# Patient Record
Sex: Female | Born: 1970 | Race: White | Hispanic: No | Marital: Single | Smoking: Never smoker
Health system: Southern US, Community
[De-identification: ages and names within clinical notes are randomized; demographics above are authoritative.]

## PROBLEM LIST (undated history)

## (undated) DIAGNOSIS — J302 Other seasonal allergic rhinitis: Secondary | ICD-10-CM

## (undated) DIAGNOSIS — E669 Obesity, unspecified: Secondary | ICD-10-CM

## (undated) DIAGNOSIS — R87619 Unspecified abnormal cytological findings in specimens from cervix uteri: Secondary | ICD-10-CM

## (undated) DIAGNOSIS — T7840XA Allergy, unspecified, initial encounter: Secondary | ICD-10-CM

## (undated) DIAGNOSIS — M719 Bursopathy, unspecified: Secondary | ICD-10-CM

## (undated) DIAGNOSIS — R87612 Low grade squamous intraepithelial lesion on cytologic smear of cervix (LGSIL): Secondary | ICD-10-CM

## (undated) DIAGNOSIS — B009 Herpesviral infection, unspecified: Secondary | ICD-10-CM

## (undated) HISTORY — DX: Unspecified abnormal cytological findings in specimens from cervix uteri: R87.619

## (undated) HISTORY — DX: Other seasonal allergic rhinitis: J30.2

## (undated) HISTORY — DX: Obesity, unspecified: E66.9

## (undated) HISTORY — PX: TUBAL LIGATION: SHX77

## (undated) HISTORY — DX: Bursopathy, unspecified: M71.9

## (undated) HISTORY — DX: Herpesviral infection, unspecified: B00.9

## (undated) HISTORY — DX: Allergy, unspecified, initial encounter: T78.40XA

---
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
## 2001-01-01 ENCOUNTER — Other Ambulatory Visit: Admission: RE | Admit: 2001-01-01 | Discharge: 2001-01-01 | Payer: Self-pay | Admitting: *Deleted

## 2001-10-23 ENCOUNTER — Other Ambulatory Visit: Admission: RE | Admit: 2001-10-23 | Discharge: 2001-10-23 | Payer: Self-pay | Admitting: *Deleted

## 2002-05-21 ENCOUNTER — Encounter (INDEPENDENT_AMBULATORY_CARE_PROVIDER_SITE_OTHER): Payer: Self-pay

## 2002-05-21 ENCOUNTER — Inpatient Hospital Stay (HOSPITAL_COMMUNITY): Admission: AD | Admit: 2002-05-21 | Discharge: 2002-05-24 | Payer: Self-pay | Admitting: Gynecology

## 2002-07-02 ENCOUNTER — Other Ambulatory Visit: Admission: RE | Admit: 2002-07-02 | Discharge: 2002-07-02 | Payer: Self-pay | Admitting: Gynecology

## 2003-06-11 HISTORY — PX: GASTRIC BYPASS: SHX52

## 2003-10-04 ENCOUNTER — Other Ambulatory Visit: Admission: RE | Admit: 2003-10-04 | Discharge: 2003-10-04 | Payer: Self-pay | Admitting: Gynecology

## 2003-11-02 ENCOUNTER — Encounter: Admission: RE | Admit: 2003-11-02 | Discharge: 2003-11-02 | Payer: Self-pay | Admitting: Surgery

## 2003-11-02 ENCOUNTER — Ambulatory Visit (HOSPITAL_COMMUNITY): Admission: RE | Admit: 2003-11-02 | Discharge: 2003-11-02 | Payer: Self-pay | Admitting: Surgery

## 2003-11-03 ENCOUNTER — Encounter: Admission: RE | Admit: 2003-11-03 | Discharge: 2004-02-01 | Payer: Self-pay | Admitting: Surgery

## 2004-01-10 ENCOUNTER — Inpatient Hospital Stay (HOSPITAL_COMMUNITY): Admission: RE | Admit: 2004-01-10 | Discharge: 2004-01-13 | Payer: Self-pay | Admitting: Surgery

## 2004-02-20 ENCOUNTER — Encounter: Admission: RE | Admit: 2004-02-20 | Discharge: 2004-05-20 | Payer: Self-pay | Admitting: Surgery

## 2004-06-26 ENCOUNTER — Encounter: Admission: RE | Admit: 2004-06-26 | Discharge: 2004-09-24 | Payer: Self-pay | Admitting: Surgery

## 2004-09-26 ENCOUNTER — Encounter: Admission: RE | Admit: 2004-09-26 | Discharge: 2004-12-25 | Payer: Self-pay | Admitting: Surgery

## 2004-10-12 ENCOUNTER — Other Ambulatory Visit: Admission: RE | Admit: 2004-10-12 | Discharge: 2004-10-12 | Payer: Self-pay | Admitting: Gynecology

## 2005-10-21 ENCOUNTER — Other Ambulatory Visit: Admission: RE | Admit: 2005-10-21 | Discharge: 2005-10-21 | Payer: Self-pay | Admitting: Gynecology

## 2006-01-20 ENCOUNTER — Encounter: Admission: RE | Admit: 2006-01-20 | Discharge: 2006-01-20 | Payer: Self-pay | Admitting: Gynecology

## 2006-01-20 IMAGING — MG MM SCREEN MAMMOGRAM BILATERAL
4 series · 4 of 4 positions shown · non-contrast
Comparison: none

DG SCREEN MAMMOGRAM BILATERAL
Bilateral CC and MLO view(s) were taken.

SCREENING MAMMOGRAM:
The breast tissue is almost entirely fatty.  There are calcifications in the right breast.  
Characterization with magnification views is recommended.  The left breast is unremarkable.

[R CC]
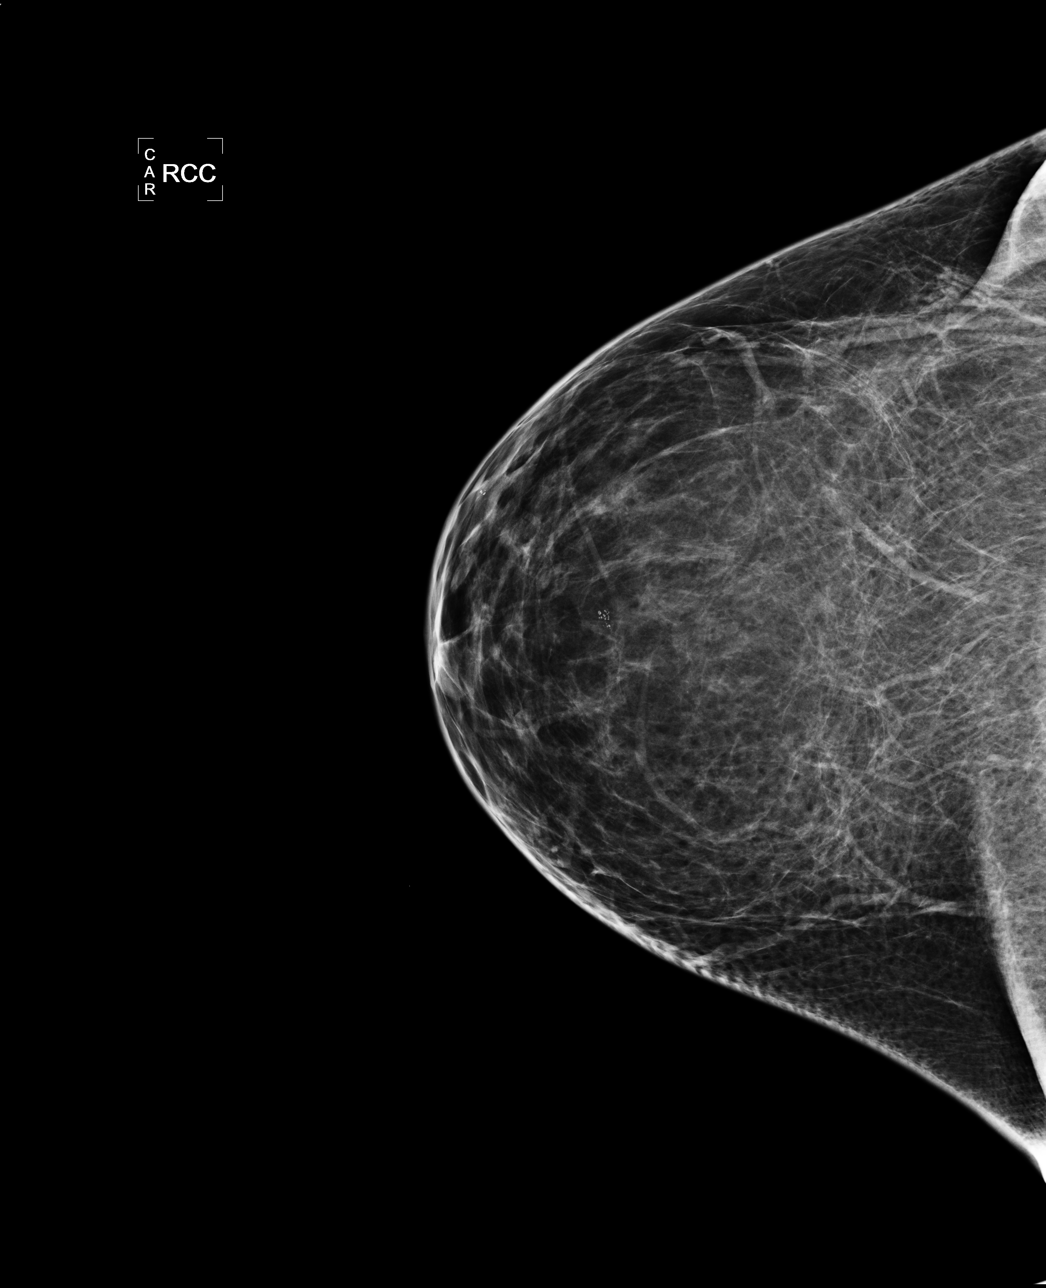

[L CC]
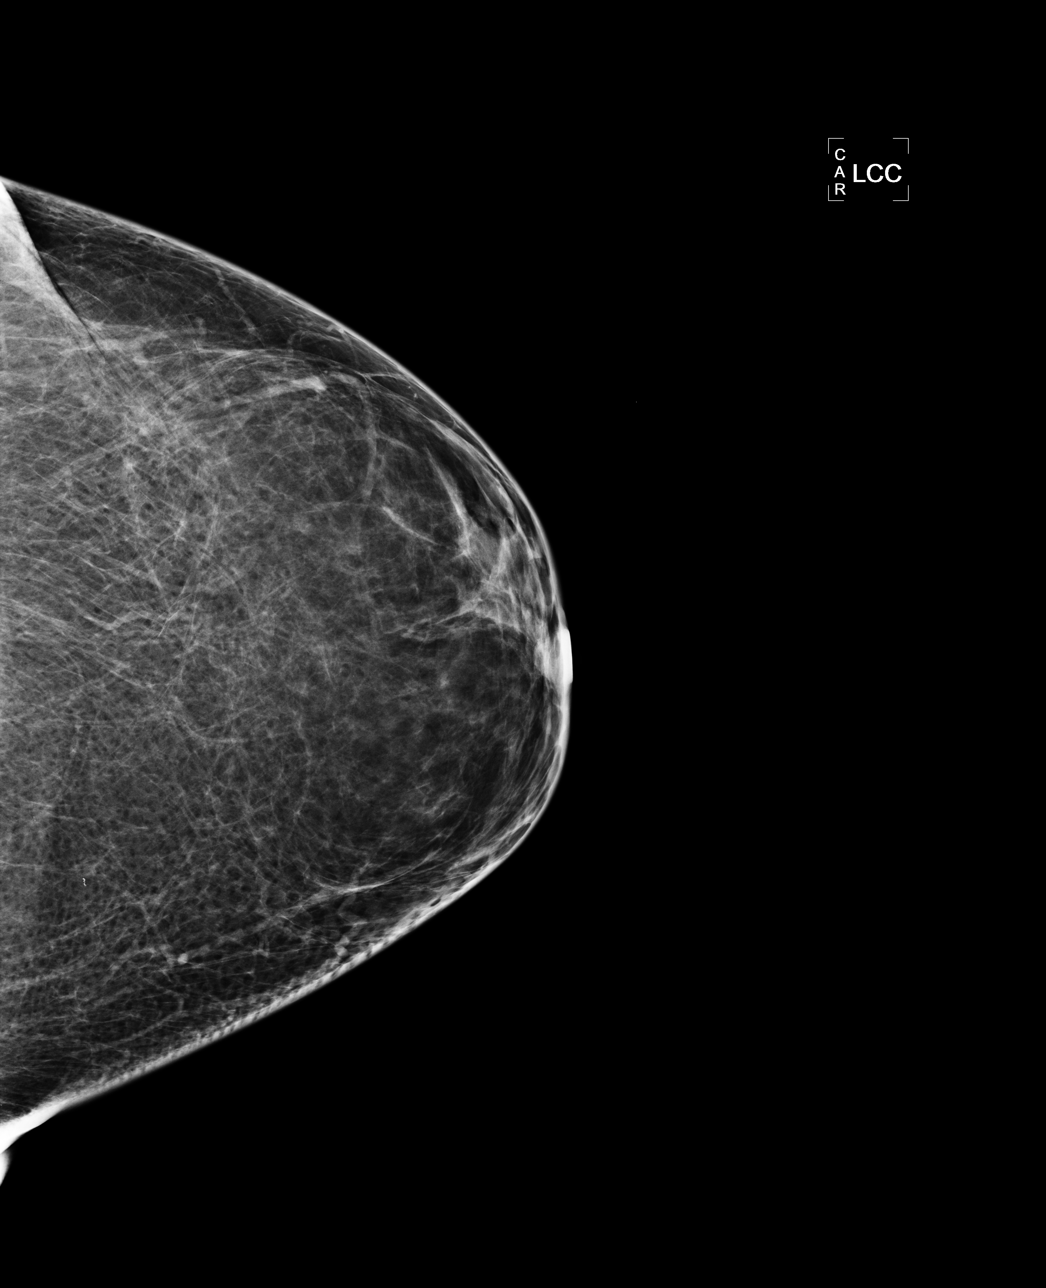

[L MLO]
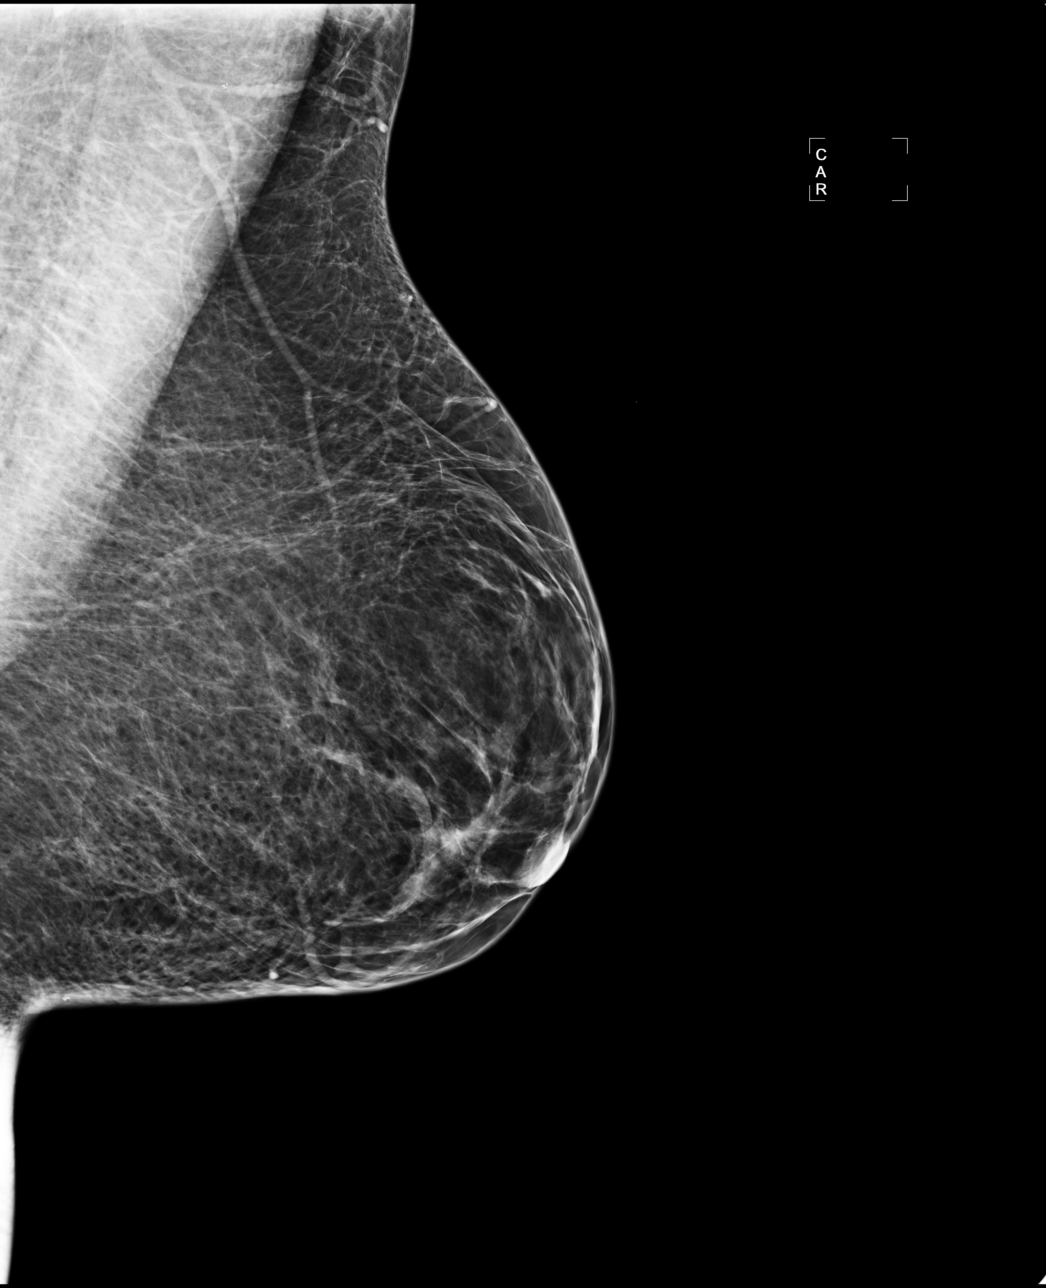

[R MLO]
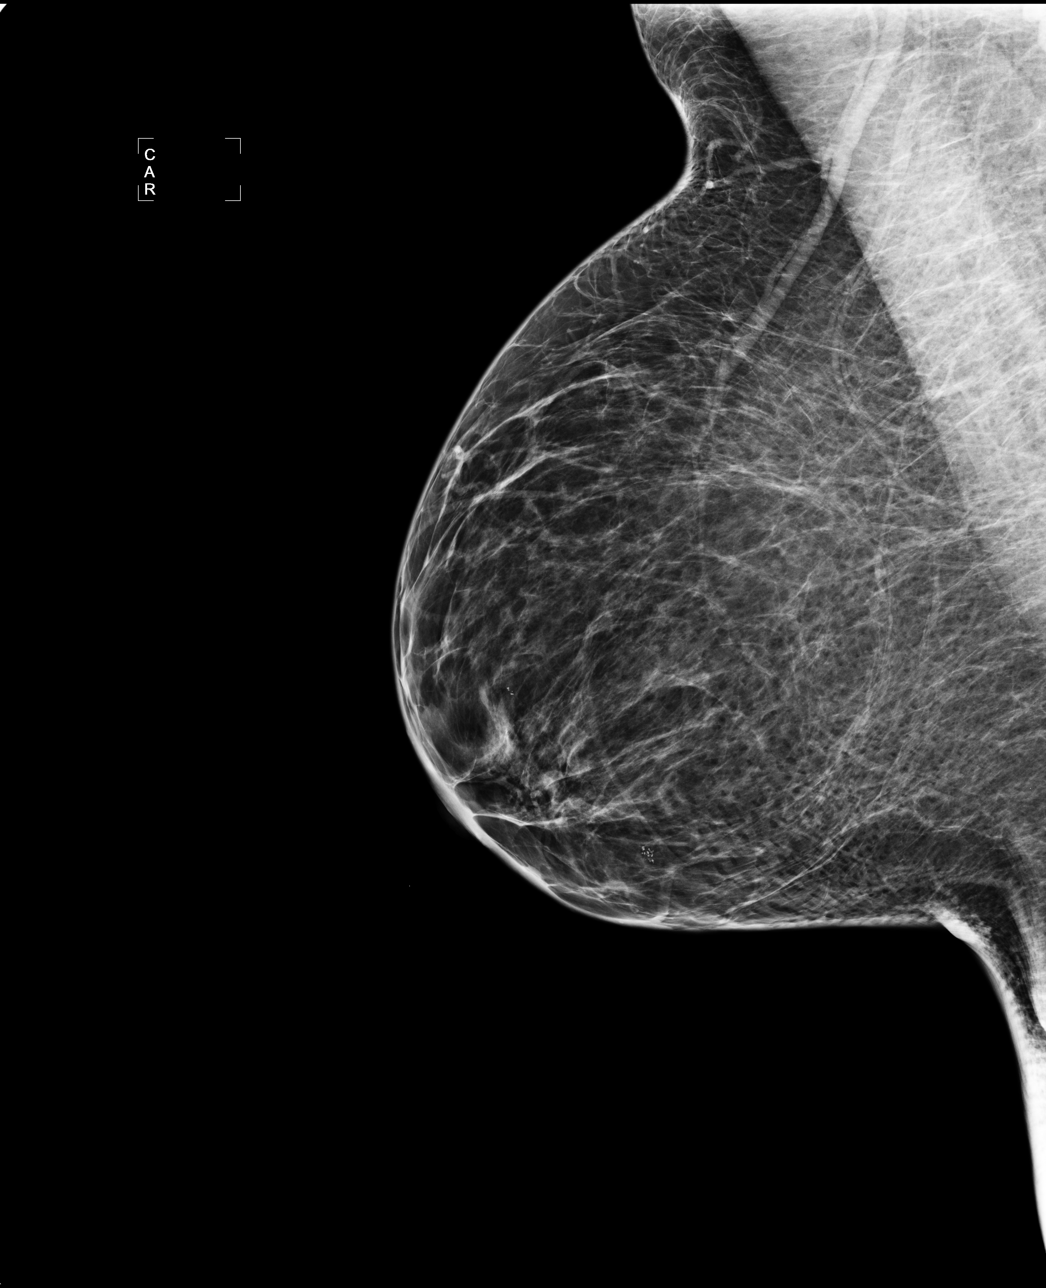

[4 of 4 positions shown; findings below may reference images not displayed]

IMPRESSION: Calcifications, right breast.  Additional evaluation is indicated.  The patient will be contacted 
for additional studies and a supplemental report will follow.

ASSESSMENT: Need additional imaging evaluation and/or prior mammograms for comparison - BI-RADS 0 -
Right

Further imaging of the right breast.
ANALYZED BY COMPUTER AIDED DETECTION. , THIS PROCEDURE WAS A DIGITAL MAMMOGRAM.

## 2006-01-24 ENCOUNTER — Encounter: Admission: RE | Admit: 2006-01-24 | Discharge: 2006-01-24 | Payer: Self-pay | Admitting: Gynecology

## 2006-10-31 ENCOUNTER — Other Ambulatory Visit: Admission: RE | Admit: 2006-10-31 | Discharge: 2006-10-31 | Payer: Self-pay | Admitting: Gynecology

## 2007-11-19 ENCOUNTER — Other Ambulatory Visit: Admission: RE | Admit: 2007-11-19 | Discharge: 2007-11-19 | Payer: Self-pay | Admitting: Gynecology

## 2008-04-07 ENCOUNTER — Ambulatory Visit: Payer: Self-pay | Admitting: Gynecology

## 2008-11-23 ENCOUNTER — Encounter: Admission: RE | Admit: 2008-11-23 | Discharge: 2008-11-23 | Payer: Self-pay | Admitting: Family Medicine

## 2008-12-20 ENCOUNTER — Other Ambulatory Visit: Admission: RE | Admit: 2008-12-20 | Discharge: 2008-12-20 | Payer: Self-pay | Admitting: Gynecology

## 2008-12-20 ENCOUNTER — Ambulatory Visit: Payer: Self-pay | Admitting: Gynecology

## 2008-12-20 ENCOUNTER — Encounter: Payer: Self-pay | Admitting: Gynecology

## 2009-06-08 ENCOUNTER — Ambulatory Visit: Payer: Self-pay | Admitting: Gynecology

## 2009-06-13 ENCOUNTER — Ambulatory Visit: Payer: Self-pay | Admitting: Gynecology

## 2009-09-14 ENCOUNTER — Ambulatory Visit: Payer: Self-pay | Admitting: Gynecology

## 2009-12-28 ENCOUNTER — Ambulatory Visit: Payer: Self-pay | Admitting: Gynecology

## 2009-12-28 ENCOUNTER — Other Ambulatory Visit: Admission: RE | Admit: 2009-12-28 | Discharge: 2009-12-28 | Payer: Self-pay | Admitting: Gynecology

## 2010-01-17 ENCOUNTER — Encounter: Admission: RE | Admit: 2010-01-17 | Discharge: 2010-01-17 | Payer: Self-pay | Admitting: Gynecology

## 2010-07-01 ENCOUNTER — Encounter: Payer: Self-pay | Admitting: Surgery

## 2010-10-26 NOTE — Op Note (Signed)
NAME:  Stacy Snyder, Stacy Snyder                           ACCOUNT NO.:  [id number]   MEDICAL RECORD NO.:  [id number]                   PATIENT TYPE:  INP   LOCATION:  0001                                 FACILITY:  [facility]   PHYSICIAN:  Thornton Park. Daphine Deutscher, M.D.             DATE OF BIRTH:  18-Oct-1970   DATE OF PROCEDURE:  01/10/2004  DATE OF DISCHARGE:                                 OPERATIVE REPORT   PREOPERATIVE DIAGNOSIS:  Morbid obesity with body mass index approximately  49.   POSTOPERATIVE DIAGNOSIS:  Morbid obesity with body mass index approximately  49.   PROCEDURE:  Laparoscopic Roux-en-Y gastric bypass.   SURGEON:  Luretha Murphy, M.D.   ASSISTANT:  Lorne Skeens. Hoxworth, M.D.   ANESTHESIA:  General endotracheal.   DESCRIPTION OF PROCEDURE:  [name]. 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  She was brought to OR #1, given general anesthesia.  The patient received preop Cefotetan and anticoagulant as well as PAS hose.  The abdomen was prepped widely with Betadine and draped sterilely.  Access  to the abdomen was gained with the OptiView technique in the left upper  quadrant using an Ethicon trocar.  The remaining portion of the case was  performed with the applied medical trocars.  A 5 mm was placed lateral to  the 12 in the left side, and then two 12s were placed on the right side and  a 10-11 down below the umbilicus.  Ultimately, a 5 mm was placed in the  upper midline for the [facility] retractor.  In the meantime, we elevated the  transverse colon and identified the ligament of Treitz.  I measured 40 cm  distally to the ligament of Treitz and divided the bowel at that point with  a single application of the GIA.  I then went down the mesentery a short  ways with the harmonic scalpel.  I put a Silastic tubing sewn to the distal  Roux limb.  I then counted 100 cm from the tip of the  Roux limb and then  oriented the distal __________ pancreatic limb to the side of the Roux limb  distally and aligned those with a single suture of silk.  I then created  holes along the antimesenteric border and then inserted the EndoGIA which I  then fired.  Common defect was closed with running endosutures with 2-0  Vicryls from either end and then tying in the middle.  There was a little  area that I felt was patent in the middle, and I put a second simple suture  to complete the closure.  Tisseal was then placed along this.  The common  defect in the mesentery was closed with 2-0 silk, again using a Laparo-Ty on  either end as I placed this to close the mesenteric defect.  Nathanson retractor was inserted.  I went up along the cardia and opened  along from the esophagus down toward the spleen.  I gently swept behind the  stomach in that location.   Next, I measured about 4 cm down on the lesser curvature and entered the  lesser sac with surprisingly minimal dissection.  The single application of  the EndoGIA was then used, transecting the stomach.  The Ewald tube was  placed as we then made a series of applications of the stapler to go up to  create a small linear pouch approximately 4 cm in length.  On the gastric  remnant, there was a little area of angulated peninsular tissue which I  subsequently resected.  Clips were placed upon the residual stomach along a  little area of bleeding at the top.  Tisseal was placed on both sides  proximally.   Next, the Roux-Y was then brought up and sutured along the staple line along  the antimesenteric border of the Roux limb with the candy-cane pointed to  the right.  This seemed to be laying nicely, and I did check the orientation  of the bowel as we brought it up.  The back row was sutured and tied first  with the long Vicryl suture with a Laparo-Ty on the other end.  Common  defect was created, first by making holes in both the anterior  gastric pouch  and along the greater antimesenteric border of the Roux limb.  The EndoGIA  was inserted into these openings, pointing more cephalad, and common defect  was created as this was fired.   We then passed the Ewald tube across the anastomosis under direct vision  since it had a tendency to want to go posteriorly.  With it across the  anastomosis, I closed the common defect with 2 sutures of Vicryl, both from  the right-most side.  This was where I spent the most time placing these  through the jejunum and through the stomach.  This was sewn out to complete  the first layer and then sewn down, and the two were tied which appeared to  have an intact anastomosis.  I then placed a second layer using a free  suture of Vicryl with a Laparo-Ty on one end, imbricating the serosal layers  of both the jejunum to the stomach, snugging this down.  And then a nice  anastomosis was present.   Dr. Johna Sheriff ended up endoscoping the patient, confirming the patency of the  anastomosis and while he was insufflated, we submerged the anastomosis and  saw no bubbles or evidence of leak.   Jackson-Pratt drain was placed beneath the liver after I applied Tisseal to  the gastrojejunostomy.  This was secured to the skin with nylon suture.  I  then surveyed the abdomen and saw no other abnormalities.  The port sites  were injected with 0.5% Marcaine with epinephrine.  The abdomen was  deflated.  Port sites were withdrawn, and skin was closed with 4-0 Vicryl.  The patient seemed to tolerate the procedure well, and she was taken to the  recovery room in satisfactory condition.                                               Thornton Park Daphine Deutscher, M.D.    MBM/MEDQ  D:  01/10/2004  T:  01/10/2004  Job:  [id number]   cc:   Nadine Counts  [address]  [place]  [place] [address]  Fax: [phone]

## 2010-10-26 NOTE — Op Note (Signed)
NAME:  Stacy Snyder, Stacy Snyder                           ACCOUNT NO.:  [id number]   MEDICAL RECORD NO.:  [id number]                   PATIENT TYPE:  INP   LOCATION:  0001                                 FACILITY:  [facility]   PHYSICIAN:  Sharlet Salina T. Hoxworth, M.D.          DATE OF BIRTH:  1970/06/14   DATE OF PROCEDURE:  01/10/2004  DATE OF DISCHARGE:                                 OPERATIVE REPORT   PREOPERATIVE DIAGNOSIS:  Morbid obesity status post laparoscopic Roux-en-Y  gastric bypass.   POSTOPERATIVE DIAGNOSIS:  Morbid obesity status post laparoscopic Roux-en-Y  gastric bypass.   DESCRIPTION OF PROCEDURE:  [name] is endoscoped intraoperatively at the  completion of a laparoscopic Roux-en-Y gastric bypass by Dr. Daphine Deutscher.  The  Olympus video endoscope was passed into the esophagus and advanced under  direct vision to the EG junction at 41 cm.  The gastric pouch was entered.  There was no bleeding.  The pouch was distended with air while being clamped  distally by Dr. Daphine Deutscher, and there was no evidence of leak.  The staple lines  appeared intact without bleeding.  The anastomosis was patent.  The pouch  measured 4 cm in length.  At the completion, air was suctioned and the scope  withdrawn.                                               Lorne Skeens. Hoxworth, M.D.    Tory Emerald  D:  01/10/2004  T:  01/10/2004  Job:  [id number]

## 2010-10-26 NOTE — H&P (Signed)
   NAME:  Stacy Snyder, Stacy Snyder                           ACCOUNT NO.:  [id number]   MEDICAL RECORD NO.:  [id number]                   PATIENT TYPE:  INP   LOCATION:  NA                                   FACILITY[facility]  [facility]   PHYSICIAN:  Timothy P. Fontaine, M.D.           DATE OF BIRTH:  10-12-1970   DATE OF ADMISSION:  05/21/2002  DATE OF DISCHARGE:                                HISTORY & PHYSICAL   CHIEF COMPLAINT:  1. Pregnancy at term.  2. Prior cesarean section, desires repeat cesarean section.   HISTORY OF PRESENT ILLNESS:  A 40 year old G2 P1 female at term gestation  with history of prior low transverse cervical cesarean section who desires  repeat cesarean section and tubal sterilization after counseling for options  to include trial of labor.  Risks, benefits, indications, and alternatives  for the procedure were discussed with her to include the permanent nature of  the sterilization procedure as well as the risk for failure.  The risks of  internal organ damage including bowel, bladder, ureters, vessels, and nerves  necessitating major exploratory reparative surgeries and future reparative  surgeries including ostomy formation was discussed with her.  Risks of  hemorrhage necessitating transfusion and the risks of transfusion were  reviewed, as well as the risks of wound complications including opening and  draining of incisions and closure by secondary intention.  Risks of  infections including prolonged antibiotics was also reviewed, as well as the  risk of fetal injury including musculoskeletal, neural, and scalpel injuries  all reviewed, understood, and accepted.  The patient's questions were  answered to her satisfaction.  For the remainder of her history and  physical, see the [facility].   PHYSICAL EXAMINATION:  VITAL SIGNS:  Afebrile, vital signs stable.  HEENT:  Normal.  LUNGS:  Clear.  CARDIAC:  Regular rate.  No rubs, murmurs, or gallops.  ABDOMEN:  Gravid, fetus  positive fetal heart rate.  PELVIC:  Deferred.   ASSESSMENT:  A 40 year old G2 P28 female, term gestation, prior cesarean  section, for repeat cesarean section and tubal sterilization.  The patient  is beta strep positive on screening.                                                Timothy P. Audie Box, M.D.    TPF/MEDQ  D:  05/19/2002  T:  05/19/2002  Job:  [id number]

## 2010-10-26 NOTE — Discharge Summary (Signed)
NAME:  Stacy Snyder, Stacy Snyder                           ACCOUNT NO.:  [id number]   MEDICAL RECORD NO.:  [id number]                   PATIENT TYPE:  INP   LOCATION:  0482                                 FACILITY:  [facility]   PHYSICIAN:  Thornton Park. Daphine Deutscher, M.D.             DATE OF BIRTH:  December 20, 1970   DATE OF ADMISSION:  01/10/2004  DATE OF DISCHARGE:  01/13/2004                                 DISCHARGE SUMMARY   ADMISSION DIAGNOSIS:  Morbid obesity.   PROCEDURE:  Laparoscopic Roux-en-Y gastric bypass on January 10, 2004.   COURSE IN THE HOSPITAL:  Stacy Snyder is a 40 year old lady admitted on  August 2 for laparoscopic Roux-en-Y gastric bypass. This was accomplished,  and the patient was taken to the floor postoperatively. Her drain drained a  little bloody fluid. On postoperative day #1, she underwent a swallow which  looked good. She was begun on our postoperative day #1 diet. Also, DVT study  was negative for deep vein thrombosis. She followed her postoperative  protocol, advancement of diet to fluid liquids, and was ready for discharge  on January 13, 2004 which was accomplished by Dr. Johna Sheriff. She was given  Roxicet for pain and was instructed to keep well hydrated, drinking at least  64 ounces a day.                                               Thornton Park Daphine Deutscher, M.D.    MBM/MEDQ  D:  01/26/2004  T:  01/27/2004  Job:  [id number]

## 2010-10-26 NOTE — Op Note (Signed)
NAME:  Stacy Snyder, Stacy Snyder                           ACCOUNT NO.:  [id number]   MEDICAL RECORD NO.:  [id number]                   PATIENT TYPE:  INP   LOCATION:  9199                                 FACILITY:  [facility]   PHYSICIAN:  Timothy P. Fontaine, M.D.           DATE OF BIRTH:  20-Sep-1970   DATE OF PROCEDURE:  05/21/2002  DATE OF DISCHARGE:                                 OPERATIVE REPORT   PREOPERATIVE DIAGNOSES:  1. Pregnancy at term.  2. Prior cesarean section, desires repeat cesarean section.  3. Multiparous, desires permanent sterilization.   POSTOPERATIVE DIAGNOSES:  1. Pregnancy at term.  2. Prior cesarean section, desires repeat cesarean section.  3. Multiparous, desires permanent sterilization.   PROCEDURE:  1. Repeat low transverse cervical cesarean section.  2. Bilateral tubal sterilization modified Pomeroy technique.   SURGEON:  Timothy P. Fontaine, M.D.   ASSISTANTGaetano Hawthorne. Lily Peer, M.D.   ANESTHESIA:  Regional.   ESTIMATED BLOOD LOSS:  Less than 500 cc.   COMPLICATIONS:  None.   SPECIMENS:  Samples of cord blood.  Portions of right and portions of left  fallopian tube.   FINDINGS:  At 69 normal female, Apgars 9 and 9.  Weight 8 pounds 7 ounces.  Pelvic anatomy noted to be normal.   PROCEDURE:  The patient was taken to the operating room.  Underwent regional  anesthesia.  Was placed in the left tilt supine position.  Received an  abdominal preparation with Betadine scrub and Betadine solution.  Bladder  emptied with indwelling Foley catheter placed in a sterile technique.  The  patient was draped in the usual fashion.  After assuring adequate anesthesia  the abdomen was sharply entered through a repeat Pfannenstiel incision  achieving adequate hemostasis at all levels.  The bladder flap was sharply  and bluntly developed without difficulty.  Uterus was sharply entered in the  lower uterine segment and bluntly extended laterally.  The bulging membranes  were ruptured, the fluid noted to be clear.  The infant's head delivered  through the incision with the assistance of the vacuum extractor.  The nares  and mouth were suctioned, the rest of infant delivered, the cord doubly  clamped and cut, and the infant was handed to pediatrics in attendance.  Samples of cord bloods were obtained.  Placenta was then spontaneously  extruded, noted to be intact.  The uterus was exteriorized and the  endometrial cavity was explored with a sponge to remove all placental and  membrane fragments.  Uterine incision was then closed in one layer using 0  Vicryl suture in a running interlocking stitch.  Two bleeding areas were  addressed using interrupted figure-of-eight 0 Vicryl sutures to achieve  ultimate hemostasis.  The left fallopian tube was then identified, traced  from its insertion to its fimbriated end and a mid tubal segment was then  doubly ligated using 0 plain suture and the  intervening tubal segment  sharply excised.  Tubal lumen as well as adequate hemostasis was grossly  visualized.  A similar procedure was carried out on the other side.  The  uterus was then returned to the abdomen which was copiously irrigated.  Both  tubal sites reinspected to assure continued hemostasis and the anterior  fascia was reapproximated using 0 Vicryl suture in a running stitch starting  at the angle and meeting in the middle.  The subcutaneous tissues were  irrigated.  Adequate hemostasis achieved with electrocautery.  A Al Pimple type drain was then placed subcutaneously and brought out through a  separate stab incision.  The skin was reapproximated using staples.  Sterile  dressing was applied.  The patient was taken to recovery room in good  condition having tolerated procedure well.                                               Timothy P. Audie Box, M.D.    TPF/MEDQ  D:  05/21/2002  T:  05/21/2002  Job:  [id number]

## 2010-10-26 NOTE — Discharge Summary (Signed)
   NAME:  Stacy Snyder, Stacy Snyder                           ACCOUNT NO.:  [id number]   MEDICAL RECORD NO.:  [id number]                   PATIENT TYPE:  INP   LOCATION:  9133                                 FACILITY:  [facility]   PHYSICIAN:  Timothy P. Fontaine, M.D.           DATE OF BIRTH:  01-06-1971   DATE OF ADMISSION:  05/21/2002  DATE OF DISCHARGE:  05/24/2002                                 DISCHARGE SUMMARY   DISCHARGE DIAGNOSES:  1. Intrauterine pregnancy 39 weeks delivered.  2. History of prior cesarean section, desires repeat cesarean section.  3. Desired attempt at permanent sterilization.  4. Positive group B Strep.  5. Status post repeat low transverse cesarean section, bilateral tubal     sterilization, modified Pomeroy technique and JP drain placement on     May 21, 2002 by Nadyne Coombes. Fontaine, M.D.   HISTORY:  This is a 40 year old female gravida 2, para 1 whose prenatal  course had been complicated by history of prior cesarean section desiring  repeat cesarean section.  She also desired attempt at permanent  sterilization and a history of HSV genital.   HOSPITAL COURSE:  On May 21, 2002 patient was admitted and subsequently  underwent a repeat low transverse cesarean section, bilateral tubal  sterilization modified Pomeroy technique procedure, and placement of JP  drain by Timothy P. Fontaine, M.D. without complications.  Postpartum  patient remained afebrile, voiding, in stable condition.  She was discharged  home on May 24, 2002 in satisfactory condition and given [facility]  Gynecology postpartum instructions, postpartum booklet.  Her JP drain was  discontinued prior to discharge.   ACCESSORY CLINICAL FINDINGS:  Laboratories:  The patient is A+.  Rubella  immune.  On May 22, 2002 hemoglobin 12.1.   DISPOSITION:  The patient is discharged to home.  She is to follow up in the  office on May 27, 2002 for staple removal.  If she has any problems  prior  to that time to be seen in the office.  Given prescription for  Percocet p.r.n. pain.     Susa Loffler, P.A.                    Timothy P. Audie Box, M.D.    TSG/MEDQ  D:  06/24/2002  T:  06/25/2002  Job:  [id number]

## 2010-12-25 ENCOUNTER — Other Ambulatory Visit: Payer: Self-pay | Admitting: Gynecology

## 2010-12-25 DIAGNOSIS — Z1231 Encounter for screening mammogram for malignant neoplasm of breast: Secondary | ICD-10-CM

## 2011-01-21 ENCOUNTER — Ambulatory Visit
Admission: RE | Admit: 2011-01-21 | Discharge: 2011-01-21 | Disposition: A | Discharge status: Home / Self Care | Payer: BC Managed Care – PPO | Source: Ambulatory Visit | Attending: Gynecology | Admitting: Gynecology

## 2011-01-21 DIAGNOSIS — Z1231 Encounter for screening mammogram for malignant neoplasm of breast: Secondary | ICD-10-CM

## 2011-02-27 ENCOUNTER — Ambulatory Visit (INDEPENDENT_AMBULATORY_CARE_PROVIDER_SITE_OTHER): Payer: BC Managed Care – PPO | Admitting: Gynecology

## 2011-02-27 ENCOUNTER — Other Ambulatory Visit (HOSPITAL_COMMUNITY)
Admission: RE | Admit: 2011-02-27 | Discharge: 2011-02-27 | Disposition: A | Discharge status: Home / Self Care | Payer: BC Managed Care – PPO | Source: Ambulatory Visit | Attending: Gynecology | Admitting: Gynecology

## 2011-02-27 ENCOUNTER — Encounter: Payer: Self-pay | Admitting: Gynecology

## 2011-02-27 VITALS — BP 106/70 | Ht 63.0 in | Wt 255.0 lb

## 2011-02-27 DIAGNOSIS — J302 Other seasonal allergic rhinitis: Secondary | ICD-10-CM | POA: Insufficient documentation

## 2011-02-27 DIAGNOSIS — A6000 Herpesviral infection of urogenital system, unspecified: Secondary | ICD-10-CM

## 2011-02-27 DIAGNOSIS — N912 Amenorrhea, unspecified: Secondary | ICD-10-CM

## 2011-02-27 DIAGNOSIS — Z01419 Encounter for gynecological examination (general) (routine) without abnormal findings: Secondary | ICD-10-CM

## 2011-02-27 DIAGNOSIS — R635 Abnormal weight gain: Secondary | ICD-10-CM

## 2011-02-27 DIAGNOSIS — Z131 Encounter for screening for diabetes mellitus: Secondary | ICD-10-CM

## 2011-02-27 DIAGNOSIS — Z13220 Encounter for screening for lipoid disorders: Secondary | ICD-10-CM

## 2011-02-27 DIAGNOSIS — B009 Herpesviral infection, unspecified: Secondary | ICD-10-CM | POA: Insufficient documentation

## 2011-02-27 MED ORDER — VALACYCLOVIR HCL 500 MG PO TABS: 500.0000 mg | ORAL_TABLET | Freq: Every day | ORAL | Status: DC

## 2011-02-27 NOTE — Progress Notes (Signed)
Stacy Snyder 06/28/1970 [id number]        40 y.o.  for annual exam.  Overall doing well complaining of weight gain. She had been on HRT due to premature menopause but decided to stop this and has done well without bleeding or symptoms like hot flushes. She also was on Prozac and she has stopped this also has done well off of it.  Past medical history,surgical history, medications, allergies, family history and social history were all reviewed and documented in the EPIC chart. ROS:  Was performed and pertinent positives and negatives are included in the history.  Exam: chaperone present Filed Vitals:   02/27/11 1126  BP: 106/70   General appearance  Normal Skin grossly normal Head/Neck normal with no cervical or supraclavicular adenopathy thyroid normal Lungs  clear Cardiac RR, without RMG Abdominal  soft, nontender, without masses, organomegaly or hernia Breasts  examined lying and sitting without masses, retractions, discharge or axillary adenopathy. Pelvic  Ext/BUS/vagina  normal   Cervix  normal  Pap done  Uterus  anteverted, normal size, shape and contour, midline and mobile nontender   Adnexa  Without masses or tenderness    Anus and perineum  normal   Rectovaginal  normal sphincter tone without palpated masses or tenderness.    Assessment/Plan:  40 y.o. female for annual exam.    #1 Premature menopause. Patient is off of HRT doing well will stay off of it at this time. If she does any bleeding she knows to let me know. I discussed with her with premature menopause there is the possibility recovery of her ovarian function she understands that. We'll recheck her [facility] today for completeness she'll follow up for these results. #2 Genital herpes. Patient uses Valtrex daily for suppression is doing well with this I refilled her Valtrex 500 mg daily x1 year #3 Weight gain. I think this is due to dietary and exercise issues and we discussed this. Will check her thyroid for completeness  she's going to put more attention to her diet and exercise. #4 Health maintenance. Self breast exams on a monthly basis discussed and urged. Recently had mammogram which was normal we'll continue with annual mammography. Baseline CBC urinalysis lipid profile glucose were ordered. I also ordered a vitamin D as she is gastric bypass patient will make sure that she is in the normal range. Otherwise assuming she continues well she'll see me in a year sooner as needed.    Dara Lords MD, 12:29 PM 02/27/2011

## 2011-05-10 ENCOUNTER — Telehealth: Payer: Self-pay | Admitting: *Deleted

## 2011-05-10 MED ORDER — FLUOXETINE HCL 20 MG PO TABS: 20.0000 mg | ORAL_TABLET | Freq: Every day | ORAL | Status: DC

## 2011-05-10 NOTE — Telephone Encounter (Signed)
PT INFORMED WITH THE BELOW NOTE. 

## 2011-05-10 NOTE — Telephone Encounter (Signed)
Pt called c/o of being very stressed out, she was currently taking Prozac 20 mg and has been off medication and doing well. Pt states she discussed with you about if she needed to start back on something to call and let you know. She is wanting something to ease her stress level if possible. Please advise

## 2011-05-10 NOTE — Telephone Encounter (Signed)
I would go ahead and restart Prozac 20 mg daily

## 2011-07-01 ENCOUNTER — Ambulatory Visit (INDEPENDENT_AMBULATORY_CARE_PROVIDER_SITE_OTHER): Payer: BC Managed Care – PPO

## 2011-07-01 DIAGNOSIS — E669 Obesity, unspecified: Secondary | ICD-10-CM

## 2011-07-01 DIAGNOSIS — F341 Dysthymic disorder: Secondary | ICD-10-CM

## 2011-07-01 DIAGNOSIS — Z0000 Encounter for general adult medical examination without abnormal findings: Secondary | ICD-10-CM

## 2011-09-05 ENCOUNTER — Other Ambulatory Visit: Payer: Self-pay | Admitting: Gynecology

## 2012-02-12 ENCOUNTER — Other Ambulatory Visit: Payer: Self-pay | Admitting: Gynecology

## 2012-02-12 DIAGNOSIS — Z1231 Encounter for screening mammogram for malignant neoplasm of breast: Secondary | ICD-10-CM

## 2012-03-01 ENCOUNTER — Ambulatory Visit (INDEPENDENT_AMBULATORY_CARE_PROVIDER_SITE_OTHER): Payer: BC Managed Care – PPO | Admitting: Internal Medicine

## 2012-03-01 VITALS — BP 120/84 | HR 54 | Temp 97.9°F | Resp 16 | Ht 63.0 in | Wt 280.0 lb

## 2012-03-01 DIAGNOSIS — R609 Edema, unspecified: Secondary | ICD-10-CM

## 2012-03-01 DIAGNOSIS — R600 Localized edema: Secondary | ICD-10-CM

## 2012-03-01 DIAGNOSIS — Z9884 Bariatric surgery status: Secondary | ICD-10-CM | POA: Insufficient documentation

## 2012-03-01 DIAGNOSIS — M25512 Pain in left shoulder: Secondary | ICD-10-CM | POA: Insufficient documentation

## 2012-03-01 DIAGNOSIS — F411 Generalized anxiety disorder: Secondary | ICD-10-CM | POA: Insufficient documentation

## 2012-03-01 DIAGNOSIS — M25519 Pain in unspecified shoulder: Secondary | ICD-10-CM

## 2012-03-01 DIAGNOSIS — E559 Vitamin D deficiency, unspecified: Secondary | ICD-10-CM | POA: Insufficient documentation

## 2012-03-01 DIAGNOSIS — G8929 Other chronic pain: Secondary | ICD-10-CM | POA: Insufficient documentation

## 2012-03-01 DIAGNOSIS — Z6841 Body Mass Index (BMI) 40.0 and over, adult: Secondary | ICD-10-CM | POA: Insufficient documentation

## 2012-03-01 LAB — T4, FREE: Free T4: 1.05 ng/dL (ref 0.80–1.80)

## 2012-03-01 LAB — COMPREHENSIVE METABOLIC PANEL
AST: 27 U/L (ref 0–37)
Albumin: 3.9 g/dL (ref 3.5–5.2)
CO2: 29 mEq/L (ref 19–32)
Calcium: 9.3 mg/dL (ref 8.4–10.5)
Creat: 0.68 mg/dL (ref 0.50–1.10)
Glucose, Bld: 89 mg/dL (ref 70–99)
Sodium: 141 mEq/L (ref 135–145)

## 2012-03-01 LAB — POCT CBC
Hemoglobin: 13.1 g/dL (ref 12.2–16.2)
Lymph, poc: 2.2 (ref 0.6–3.4)
POC Granulocyte: 4.1 (ref 2–6.9)
POC LYMPH PERCENT: 32.9 %L (ref 10–50)
Platelet Count, POC: 339 10*3/uL (ref 142–424)
RDW, POC: 13.1 %
WBC: 6.8 10*3/uL (ref 4.6–10.2)

## 2012-03-01 LAB — LIPID PANEL
Cholesterol: 183 mg/dL (ref 0–200)
Triglycerides: 93 mg/dL (ref <150)
VLDL: 19 mg/dL (ref 0–40)

## 2012-03-01 LAB — TSH: TSH: 2.371 u[IU]/mL (ref 0.350–4.500)

## 2012-03-01 MED ORDER — SPIRONOLACTONE 25 MG PO TABS: 25.0000 mg | ORAL_TABLET | Freq: Every day | ORAL | Status: DC

## 2012-03-01 MED ORDER — MELOXICAM 15 MG PO TABS: 15.0000 mg | ORAL_TABLET | Freq: Every day | ORAL | Status: DC

## 2012-03-01 NOTE — Progress Notes (Signed)
Subjective:    Patient ID: Stacy Snyder, female    DOB: 1970/07/31, 41 y.o.   MRN: [id number]  HPIComplaining of left foot swelling with tenderness for the last 7-14 days/can see imprint shoe in foot No injury/no redness/no joint pains except shoulders(see ros) No history of heart problems or thyroid disorders/no urinary problems she restarted Prozac about 3 months ago-when on this before had no fluid retention   OW even in college Gastric bypass 2005-matt martin lost 115# 255 to 280 in last year--diets and weight watchers even are not working 1500 cal diet, then 1200cal diet, Carb cycling.--nothing works Arts administrator diary shows restricted carbohydrate  Review of Systems  Constitutional: Positive for fatigue and unexpected weight change. Negative for activity change and appetite change.  HENT: Negative for trouble swallowing.        Seasonal allergies  Eyes: Negative for visual disturbance.  Respiratory: Negative for shortness of breath and wheezing.   Cardiovascular: Positive for leg swelling. Negative for chest pain and palpitations.  Gastrointestinal: Negative for nausea, abdominal pain, diarrhea and constipation.       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       Menopause at 36-Dr Fontaine  Musculoskeletal: Positive for joint swelling and arthralgias.       Arthritis noted 2010 L shoulder injury xrays both shoulders w/ arth changes Pain daily now occas sleep interupted  Skin: Negative for rash.  Neurological: Negative for light-headedness.       Occas HAs stress related  Hematological: Does not bruise/bleed easily.  Psychiatric/Behavioral: Positive for disturbed wake/sleep cycle.       Stressed by busy life w/ 2 kids and strongwilled 8yo       Objective:   Physical Exam Vital signs stable except BMI over 45 No acute distress HEENT clear without thyromegaly Heart regular without murmurs rubs  clicks or gallops Lungs clear Both shoulders with pain on resisted abduction, pain on external rotation, and pain with anterior elevation against resistance/no crepitus/nonswollen/overhead movements restricted Trace to 1+ edema over both lower extremities pretibial and ankle Full peripheral pulses Deep tendon reflexes symmetrical No cords or masses in the muscles of the lower extremities   Results for orders placed in visit on 03/01/12  POCT CBC      Component Value Range   WBC 6.8  4.6 - 10.2 K/uL   Lymph, poc 2.2  0.6 - 3.4   POC LYMPH PERCENT 32.9  10 - 50 %L   MID (cbc) 0.5  0 - 0.9   POC MID % 7.1  0 - 12 %M   POC Granulocyte 4.1  2 - 6.9   Granulocyte percent 60.0  37 - 80 %G   RBC 4.41  4.04 - 5.48 M/uL   Hemoglobin 13.1  12.2 - 16.2 g/dL   HCT, POC 40.9  81.1 - 47.9 %   MCV 94.7  80 - 97 fL   MCH, POC 29.7  27 - 31.2 pg   MCHC 31.3 (*) 31.8 - 35.4 g/dL   RDW, POC 91.4     Platelet Count, POC 339  142 - 424 K/uL   MPV 9.8  0 - 99.8 fL      45 minute office visit Assessment & Plan:  Problem #1 peripheral edema-Labs ordered/start spironolactone 25 mg daily Problem #2 morbid obesity Status post gastric bypass-Will review after lab work Problem #3 generalized anxiety disorder-No change in meds  problem #4Chronic shoulder pain-refer  to ortho/Mobic 15 mg daily until then Problem #5 vitamin D deficiency  Followup appointment 6 weeks

## 2012-03-02 LAB — VITAMIN D 25 HYDROXY (VIT D DEFICIENCY, FRACTURES): Vit D, 25-Hydroxy: 30 ng/mL (ref 30–89)

## 2012-03-03 ENCOUNTER — Encounter: Payer: Self-pay | Admitting: Internal Medicine

## 2012-03-09 ENCOUNTER — Ambulatory Visit (INDEPENDENT_AMBULATORY_CARE_PROVIDER_SITE_OTHER): Payer: BC Managed Care – PPO | Admitting: Gynecology

## 2012-03-09 ENCOUNTER — Encounter: Payer: Self-pay | Admitting: Gynecology

## 2012-03-09 ENCOUNTER — Ambulatory Visit
Admission: RE | Admit: 2012-03-09 | Discharge: 2012-03-09 | Disposition: A | Discharge status: Home / Self Care | Payer: BC Managed Care – PPO | Source: Ambulatory Visit | Attending: Gynecology | Admitting: Gynecology

## 2012-03-09 VITALS — BP 92/78 | Ht 63.0 in | Wt 279.0 lb

## 2012-03-09 DIAGNOSIS — N951 Menopausal and female climacteric states: Secondary | ICD-10-CM

## 2012-03-09 DIAGNOSIS — Z01419 Encounter for gynecological examination (general) (routine) without abnormal findings: Secondary | ICD-10-CM

## 2012-03-09 DIAGNOSIS — M7541 Impingement syndrome of right shoulder: Secondary | ICD-10-CM | POA: Insufficient documentation

## 2012-03-09 DIAGNOSIS — Z1231 Encounter for screening mammogram for malignant neoplasm of breast: Secondary | ICD-10-CM

## 2012-03-09 DIAGNOSIS — Z79890 Hormone replacement therapy (postmenopausal): Secondary | ICD-10-CM

## 2012-03-09 MED ORDER — PROGESTERONE MICRONIZED 100 MG PO CAPS: 100.0000 mg | ORAL_CAPSULE | Freq: Every day | ORAL | Status: DC

## 2012-03-09 MED ORDER — ESTRADIOL 0.075 MG/24HR TD PTTW: 1.0000 | MEDICATED_PATCH | TRANSDERMAL | Status: DC

## 2012-03-09 NOTE — Patient Instructions (Signed)

## 2012-03-09 NOTE — Progress Notes (Signed)
Stacy Snyder November 09, 1970 [id number]        41 y.o.  G2P2 for annual exam.  Several issues noted below.  Past medical history,surgical history, medications, allergies, family history and social history were all reviewed and documented in the EPIC chart. ROS:  Was performed and pertinent positives and negatives are included in the history.  Exam: Stacy Snyder assistant Filed Vitals:   03/09/12 0855  BP: 92/78  Height: 5\' 3"  (1.6 m)  Weight: 279 lb (126.554 kg)   General appearance  Normal Skin grossly normal Head/Neck normal with no cervical or supraclavicular adenopathy thyroid normal Lungs  clear Cardiac RR, without RMG Abdominal  soft, nontender, without masses, organomegaly or hernia Breasts  examined lying and sitting without masses, retractions, discharge or axillary adenopathy. Pelvic  Ext/BUS/vagina  normal   Cervix  normal   Uterus  grossly normal size, shape and contour, midline and mobile nontender, exam limited by abdominal girth   Adnexa  Without masses or tenderness, exam limited by abdominal girth    Anus and perineum  normal   Rectovaginal  normal sphincter tone without palpated masses or tenderness.    Assessment/Plan:  41 y.o. G2P2 female for annual exam.   1. Postmenopausal with menopausal symptoms. Patient has undergone premature menopause, remains amenorrheic with elevated FSH checked last year. Is now having hot flashes and sweats. We discussed options to include HRT. I reviewed the WHI study with increased risk of stroke, heart attack, DVT and increased risk of breast cancer. Options as far as oral versus transdermal, first pass effect. Benefits as far as cardiovascular/bone health all reviewed. Patient wants to try HRT. I recommended MiniVivelle 0.075 patches and Prometrium 100 mg nightly. Need to call if any bleeding reviewed. Patient will follow up if her hot flashes continue or she has any issues at all her questions with her HRT. 2. Obesity. We discussed her  weight. She continues to gain weight despite bariatric surgery. Her thyroid has been checked previously.  She does admit to not exercising. Have recommended she follow up with Dr. Daphine Deutscher who did her original surgery, a dietitian and to begin an exercise program. 3. Mammography. Patient had her mammogram this morning. We'll continue with annual mammography. SBE monthly reviewed. 4. Pap smear. No Pap smear done today. Last Pap smear 2012. She has numerous normal reports her chart with no history of abnormal Pap smears before. We'll plan every 3-5 year screening per current screening guidelines. 5. Health maintenance. No lab work was done today this is done through her primary physician Dr. Merla Riches and she recently had a number of labs done through their office.    Dara Lords MD, 9:55 AM 03/09/2012

## 2012-03-10 LAB — URINALYSIS W MICROSCOPIC + REFLEX CULTURE
Casts: NONE SEEN
Crystals: NONE SEEN
Glucose, UA: NEGATIVE mg/dL
Hgb urine dipstick: NEGATIVE
Leukocytes, UA: NEGATIVE
pH: 8 (ref 5.0–8.0)

## 2012-03-13 ENCOUNTER — Encounter: Payer: Self-pay | Admitting: Gynecology

## 2012-03-18 ENCOUNTER — Telehealth: Payer: Self-pay

## 2012-03-18 ENCOUNTER — Ambulatory Visit (INDEPENDENT_AMBULATORY_CARE_PROVIDER_SITE_OTHER): Payer: BC Managed Care – PPO | Admitting: Family Medicine

## 2012-03-18 VITALS — BP 128/84 | HR 82 | Temp 98.4°F | Resp 17 | Ht 63.0 in | Wt 282.0 lb

## 2012-03-18 DIAGNOSIS — R609 Edema, unspecified: Secondary | ICD-10-CM

## 2012-03-18 DIAGNOSIS — R600 Localized edema: Secondary | ICD-10-CM

## 2012-03-18 MED ORDER — FUROSEMIDE 20 MG PO TABS: 20.0000 mg | ORAL_TABLET | Freq: Every day | ORAL | Status: DC

## 2012-03-18 NOTE — Telephone Encounter (Signed)
RTC

## 2012-03-18 NOTE — Telephone Encounter (Signed)
When patient was here last she said we put her on a fluid pill for her swelling, she is swollen again that pill is not working would like to know if we could call something else in for her please call her at [phone]

## 2012-03-18 NOTE — Progress Notes (Signed)
41 yo woman with 3 weeks of progressive lower extremity swelling and discomfort despite no change in diet, normal labs (Thyroid, CMET), no intestinal problems, and recently gyencological visit.  She has had no calf or thigh pains or shortness of breath.  The swelling does go down at night.    She works for the [employer] system.  The aldactone has not resulted in any noticeable improvement or diuresis; in fact the swelling has gotten worse.  Objective:  NAD Symmetrical lower pretibial edema Chest:  Clear Heart:  Regular, no murmur Abdomen:  Soft, nontender, morbidly obese Skin:  Mild erythema just above ankles which blanch with foot dorsiflexion  Assessment:  Unexplained edema  Plan:   1. Pedal edema  Vas Lab Arterial/Venous, furosemide (LASIX) 20 MG tablet   If no dx, abdominal u/s

## 2012-03-18 NOTE — Patient Instructions (Addendum)
Edema Edema is an abnormal build-up of fluids in tissues. Because this is partly dependent on gravity (water flows to the lowest place), it is more common in the legs and thighs (lower extremities). It is also common in the looser tissues, like around the eyes. Painless swelling of the feet and ankles is common and increases as a person ages. It may affect both legs and may include the calves or even thighs. When squeezed, the fluid may move out of the affected area and may leave a dent for a few moments. CAUSES   Prolonged standing or sitting in one place for extended periods of time. Movement helps pump tissue fluid into the veins, and absence of movement prevents this, resulting in edema.  Varicose veins. The valves in the veins do not work as well as they should. This causes fluid to leak into the tissues.  Fluid and salt overload.  Injury, burn, or surgery to the leg, ankle, or foot, may damage veins and allow fluid to leak out.  Sunburn damages vessels. Leaky vessels allow fluid to go out into the sunburned tissues.  Allergies (from insect bites or stings, medications or chemicals) cause swelling by allowing vessels to become leaky.  Protein in the blood helps keep fluid in your vessels. Low protein, as in malnutrition, allows fluid to leak out.  Hormonal changes, including pregnancy and menstruation, cause fluid retention. This fluid may leak out of vessels and cause edema.  Medications that cause fluid retention. Examples are sex hormones, blood pressure medications, steroid treatment, or anti-depressants.  Some illnesses cause edema, especially heart failure, kidney disease, or liver disease.  Surgery that cuts veins or lymph nodes, such as surgery done for the heart or for breast cancer, may result in edema. DIAGNOSIS  Your caregiver is usually easily able to determine what is causing your swelling (edema) by simply asking what is wrong (getting a history) and examining you (doing  a physical). Sometimes x-rays, EKG (electrocardiogram or heart tracing), and blood work may be done to evaluate for underlying medical illness. TREATMENT  General treatment includes:  Leg elevation (or elevation of the affected body part).  Restriction of fluid intake.  Prevention of fluid overload.  Compression of the affected body part. Compression with elastic bandages or support stockings squeezes the tissues, preventing fluid from entering and forcing it back into the blood vessels.  Diuretics (also called water pills or fluid pills) pull fluid out of your body in the form of increased urination. These are effective in reducing the swelling, but can have side effects and must be used only under your caregiver's supervision. Diuretics are appropriate only for some types of edema. The specific treatment can be directed at any underlying causes discovered. Heart, liver, or kidney disease should be treated appropriately. HOME CARE INSTRUCTIONS   Elevate the legs (or affected body part) above the level of the heart, while lying down.  Avoid sitting or standing still for prolonged periods of time.  Avoid putting anything directly under the knees when lying down, and do not wear constricting clothing or garters on the upper legs.  Exercising the legs causes the fluid to work back into the veins and lymphatic channels. This may help the swelling go down.  The pressure applied by elastic bandages or support stockings can help reduce ankle swelling.  A low-salt diet may help reduce fluid retention and decrease the ankle swelling.  Take any medications exactly as prescribed. SEEK MEDICAL CARE IF:  Your edema is   not responding to recommended treatments. SEEK IMMEDIATE MEDICAL CARE IF:   You develop shortness of breath or chest pain.  You cannot breathe when you lay down; or if, while lying down, you have to get up and go to the window to get your breath.  You are having increasing  swelling without relief from treatment.  You develop a fever over 102 F (38.9 C).  You develop pain or redness in the areas that are swollen.  Tell your caregiver right away if you have gained 3 lb/1.4 kg in 1 day or 5 lb/2.3 kg in a week. MAKE SURE YOU:   Understand these instructions.  Will watch your condition.  Will get help right away if you are not doing well or get worse. Document Released: 05/27/2005 Document Revised: 11/26/2011 Document Reviewed: 01/13/2008 [employer] Patient Information 2013 [employer].  

## 2012-03-18 NOTE — Telephone Encounter (Signed)
Yes, the patient needs to be evaluated

## 2012-03-19 ENCOUNTER — Telehealth: Payer: Self-pay | Admitting: Radiology

## 2012-03-19 DIAGNOSIS — E669 Obesity, unspecified: Secondary | ICD-10-CM

## 2012-03-19 DIAGNOSIS — R600 Localized edema: Secondary | ICD-10-CM

## 2012-03-19 NOTE — Telephone Encounter (Signed)
I called patient she came in yesterday for this.

## 2012-03-19 NOTE — Telephone Encounter (Signed)
Dr Milus Glazier ordered lower ext Venous and Arterial studies for patient, vascular lab has called to question if only venous needed. I have advised and have verified with Eula Listen, per Dr Milus Glazier venous and arterial studies are both needed. Patient scheduled for tomorrow.   FYI

## 2012-03-19 NOTE — Addendum Note (Signed)
Addended by: Thelma Barge D on: 03/19/2012 11:41 AM   Modules accepted: Orders

## 2012-03-20 ENCOUNTER — Ambulatory Visit (HOSPITAL_COMMUNITY)
Admission: RE | Admit: 2012-03-20 | Discharge: 2012-03-20 | Disposition: A | Discharge status: Home / Self Care | Payer: BC Managed Care – PPO | Source: Ambulatory Visit | Attending: Family Medicine | Admitting: Family Medicine

## 2012-03-20 ENCOUNTER — Ambulatory Visit (HOSPITAL_COMMUNITY): Payer: BC Managed Care – PPO

## 2012-03-20 DIAGNOSIS — M79609 Pain in unspecified limb: Secondary | ICD-10-CM | POA: Insufficient documentation

## 2012-03-20 DIAGNOSIS — E669 Obesity, unspecified: Secondary | ICD-10-CM

## 2012-03-20 DIAGNOSIS — R609 Edema, unspecified: Secondary | ICD-10-CM | POA: Insufficient documentation

## 2012-03-20 DIAGNOSIS — Z6841 Body Mass Index (BMI) 40.0 and over, adult: Secondary | ICD-10-CM

## 2012-03-20 DIAGNOSIS — R600 Localized edema: Secondary | ICD-10-CM

## 2012-03-20 NOTE — Progress Notes (Signed)
VASCULAR LAB PRELIMINARY  ARTERIAL  ABI completed:    RIGHT    LEFT    PRESSURE WAVEFORM  PRESSURE WAVEFORM  BRACHIAL 132 T BRACHIAL 125 T  DP   DP    AT 141 T AT 150 T  PT 149 T PT 140 T  PER   PER    GREAT TOE  NA GREAT TOE  NA    RIGHT LEFT  ABI >1.0 >1.0     Kalyb Pemble, 03/20/2012, 10:37 AM

## 2012-03-20 NOTE — Progress Notes (Signed)
Bilateral:  No evidence of DVT, superficial thrombosis, or Baker's Cyst.   

## 2012-03-30 ENCOUNTER — Ambulatory Visit (INDEPENDENT_AMBULATORY_CARE_PROVIDER_SITE_OTHER): Payer: BC Managed Care – PPO | Admitting: Family Medicine

## 2012-03-30 VITALS — BP 117/77 | HR 69 | Temp 98.3°F | Resp 18 | Ht 63.0 in | Wt 281.6 lb

## 2012-03-30 DIAGNOSIS — M25519 Pain in unspecified shoulder: Secondary | ICD-10-CM

## 2012-03-30 DIAGNOSIS — R600 Localized edema: Secondary | ICD-10-CM

## 2012-03-30 DIAGNOSIS — R609 Edema, unspecified: Secondary | ICD-10-CM

## 2012-03-30 MED ORDER — MELOXICAM 15 MG PO TABS: 15.0000 mg | ORAL_TABLET | Freq: Every day | ORAL | Status: DC

## 2012-03-30 MED ORDER — TORSEMIDE 20 MG PO TABS: 20.0000 mg | ORAL_TABLET | Freq: Every day | ORAL | Status: DC

## 2012-03-30 NOTE — Progress Notes (Signed)
41 yo woman with 6 weeks of progressive lower extremity swelling and discomfort despite no change in diet, normal labs (Thyroid, CMET), no intestinal problems, and recently gyencological visit. She has had no calf or thigh pains or shortness of breath. The swelling does go down at night.  The lasix worked for several days. She works for the [employer] system.  The aldactone has not resulted in any noticeable improvement or diuresis; in fact the swelling has gotten worse.   Objective: NAD Symmetrical lower pretibial edema  Chest: Clear  Heart: Regular, no murmur  Abdomen: Soft, nontender, morbidly obese  Skin: Mild erythema just above ankles which blanch with foot dorsiflexion   Venous dopplers were normal  Assessment:  Weight seems to be the factor  Plan:  Continue efforts at weight loss  Change to torsemide 1. Pedal edema  torsemide (DEMADEX) 20 MG tablet

## 2012-03-31 ENCOUNTER — Encounter: Payer: Self-pay | Admitting: Family Medicine

## 2012-03-31 DIAGNOSIS — M7541 Impingement syndrome of right shoulder: Secondary | ICD-10-CM

## 2012-04-02 ENCOUNTER — Ambulatory Visit (INDEPENDENT_AMBULATORY_CARE_PROVIDER_SITE_OTHER): Payer: Self-pay | Admitting: Surgery

## 2012-05-01 ENCOUNTER — Other Ambulatory Visit: Payer: Self-pay | Admitting: Family Medicine

## 2012-05-06 ENCOUNTER — Ambulatory Visit (INDEPENDENT_AMBULATORY_CARE_PROVIDER_SITE_OTHER): Payer: BC Managed Care – PPO | Admitting: Internal Medicine

## 2012-05-06 ENCOUNTER — Encounter: Payer: Self-pay | Admitting: Internal Medicine

## 2012-05-06 VITALS — BP 120/76 | HR 72 | Temp 97.6°F | Resp 16 | Ht 63.0 in | Wt 274.0 lb

## 2012-05-06 DIAGNOSIS — Z6841 Body Mass Index (BMI) 40.0 and over, adult: Secondary | ICD-10-CM

## 2012-05-06 DIAGNOSIS — R609 Edema, unspecified: Secondary | ICD-10-CM

## 2012-05-06 DIAGNOSIS — R600 Localized edema: Secondary | ICD-10-CM

## 2012-05-06 DIAGNOSIS — Z9884 Bariatric surgery status: Secondary | ICD-10-CM

## 2012-05-06 NOTE — Progress Notes (Signed)
  Subjective:    Patient ID: Stacy Snyder, female    DOB: [birth year], 41 y.o.   MRN: [id number]  HPIsee hx edema No chg w/ spironolac Dr L lasix=no, then Demedex= fair response but w/ no etio other than obesity See unrevealing labs   Dr Daphine Deutscher 05/21/12 appt-?band next since bypass ineffective Review of Systems No additional sxt of DOE,SOB, Chest pain, palpitations No polyuria,polydipsia    Objective:   Physical Exam VS=wt 274!! Hear reg No goiter Extr= trace to 1+ edema bilat       Assessment & Plan:  Edema refractory-?etiology/ ?post bypass  She would like to consult with nephrology to see if there are any other solutions

## 2012-05-11 ENCOUNTER — Telehealth: Payer: Self-pay | Admitting: Gynecology

## 2012-05-11 MED ORDER — VALACYCLOVIR HCL 1 G PO TABS: ORAL_TABLET | ORAL | Status: DC

## 2012-05-11 MED ORDER — PROGESTERONE MICRONIZED 200 MG PO CAPS: 200.0000 mg | ORAL_CAPSULE | Freq: Every day | ORAL | Status: DC

## 2012-05-11 NOTE — Telephone Encounter (Signed)
Recommend taking Prometrium 200 mg nightly for 2 weeks. Them back to 100 mg nightly. If bleeding continues recommend scheduling sonohysterogram. Okay for Valtrex 1000 mg one half daily as a suppression dose. If she has a persistent area that I would take one half tab twice daily for 5-7 days. If the area persists then I recommend office visit for evaluation. I prescribed additional medication for both above

## 2012-05-11 NOTE — Telephone Encounter (Signed)
Patient called with two issues.  #1 c/o vaginal bleeding off and on since starting HRT in Sept.  What to rec?  #2 Patient said you used to prescribed Valtrex 1000 mg and she would just break tablet in half when she needed to.  She said she has been taking 500 mg every day for weeks and has a sore she cannot get rid of.  She said it would be more cost effective for her if you would prescribe the 1000 mg for her.

## 2012-05-11 NOTE — Telephone Encounter (Signed)
Patient informed/advised of below.

## 2012-05-18 ENCOUNTER — Telehealth: Payer: Self-pay | Admitting: *Deleted

## 2012-05-18 NOTE — Telephone Encounter (Signed)
(  Follow up telephone encounter 05/11/12) pt is taking Prometrium 200 mg nightly it has not been two weeks yet, she is still bleeding. I explained to pt once again to take medication as directed for prometrium 200 mg nightly for 2 weeks. then back to 100 mg nightly as noted. Pt said should " So I will just will continue to bleed for 2 weeks" I told her I cant tell her when bleeding will stop. She asked me to let you bleeding still there. Please advise

## 2012-05-18 NOTE — Telephone Encounter (Signed)
Pt informed with the below note. Appointment will be scheduled.

## 2012-05-18 NOTE — Telephone Encounter (Signed)
As patient has continued to bleed I recommend sonohysterogram to make sure there are no endometrial defects or abnormalities. Schedule sonohysterogram with patient.

## 2012-05-21 ENCOUNTER — Ambulatory Visit (INDEPENDENT_AMBULATORY_CARE_PROVIDER_SITE_OTHER): Payer: BC Managed Care – PPO | Admitting: Surgery

## 2012-05-21 ENCOUNTER — Encounter (INDEPENDENT_AMBULATORY_CARE_PROVIDER_SITE_OTHER): Payer: Self-pay | Admitting: Surgery

## 2012-05-21 ENCOUNTER — Telehealth (INDEPENDENT_AMBULATORY_CARE_PROVIDER_SITE_OTHER): Payer: Self-pay

## 2012-05-21 ENCOUNTER — Other Ambulatory Visit (INDEPENDENT_AMBULATORY_CARE_PROVIDER_SITE_OTHER): Payer: Self-pay

## 2012-05-21 VITALS — BP 128/86 | HR 60 | Temp 97.8°F | Resp 14 | Ht 63.0 in | Wt 266.8 lb

## 2012-05-21 DIAGNOSIS — Z9884 Bariatric surgery status: Secondary | ICD-10-CM

## 2012-05-21 DIAGNOSIS — M7989 Other specified soft tissue disorders: Secondary | ICD-10-CM

## 2012-05-21 NOTE — Patient Instructions (Signed)
Calorie Counting Diet A calorie counting diet requires you to eat the number of calories that are right for you in a day. Calories are the measurement of how much energy you get from the food you eat. Eating the right amount of calories is important for staying at a healthy weight. If you eat too many calories, your body will store them as fat and you may gain weight. If you eat too few calories, you may lose weight. Counting the number of calories you eat during a day will help you know if you are eating the right amount. A Registered Dietitian can determine how many calories you need in a day. The amount of calories needed varies from person to person. If your goal is to lose weight, you will need to eat fewer calories. Losing weight can benefit you if you are overweight or have health problems such as heart disease, high blood pressure, or diabetes. If your goal is to gain weight, you will need to eat more calories. Gaining weight may be necessary if you have a certain health problem that causes your body to need more energy. TIPS Whether you are increasing or decreasing the number of calories you eat during a day, it may be hard to get used to changes in what you eat and drink. The following are tips to help you keep track of the number of calories you eat.  Measure foods at home with measuring cups. This helps you know the amount of food and number of calories you are eating.  Restaurants often serve food in amounts that are larger than 1 serving. While eating out, estimate how many servings of a food you are given. For example, a serving of cooked rice is  cup or about the size of half of a fist. Knowing serving sizes will help you be aware of how much food you are eating at restaurants.  Ask for smaller portion sizes or child-size portions at restaurants.  Plan to eat half of a meal at a restaurant. Take the rest home or share the other half with a friend.  Read the Nutrition Facts panel on  food labels for calorie content and serving size. You can find out how many servings are in a package, the size of a serving, and the number of calories each serving has.  For example, a package might contain 3 cookies. The Nutrition Facts panel on that package says that 1 serving is 1 cookie. Below that, it will say there are 3 servings in the container. The calories section of the Nutrition Facts label says there are 90 calories. This means there are 90 calories in 1 cookie (1 serving). If you eat 1 cookie you have eaten 90 calories. If you eat all 3 cookies, you have eaten 270 calories (3 servings x 90 calories = 270 calories). The list below tells you how big or small some common portion sizes are.  1 oz.........4 stacked dice.  3 oz.........Deck of cards.  1 tsp........Tip of little finger.  1 tbs........Thumb.  2 tbs........Golf ball.   cup.......Half of a fist.  1 cup........A fist. KEEP A FOOD LOG Write down every food item you eat, the amount you eat, and the number of calories in each food you eat during the day. At the end of the day, you can add up the total number of calories you have eaten. It may help to keep a list like the one below. Find out the calorie information by reading the   Nutrition Facts panel on food labels. Breakfast  Bran cereal (1 cup, 110 calories).  Fat-free milk ( cup, 45 calories). Snack  Apple (1 medium, 80 calories). Lunch  Spinach (1 cup, 20 calories).  Tomato ( medium, 20 calories).  Chicken breast strips (3 oz, 165 calories).  Shredded cheddar cheese ( cup, 110 calories).  Light [place] dressing (2 tbs, 60 calories).  Whole-wheat bread (1 slice, 80 calories).  Tub margarine (1 tsp, 35 calories).  Vegetable soup (1 cup, 160 calories). Dinner  Pork chop (3 oz, 190 calories).  Brown rice (1 cup, 215 calories).  Steamed broccoli ( cup, 20 calories).  Strawberries (1  cup, 65 calories).  Whipped cream (1 tbs, 50  calories). Daily Calorie Total: 1425 Document Released: 05/27/2005 Document Revised: 08/19/2011 Document Reviewed: 11/21/2006 [facility] Patient Information 2013 [place], [place]. Dieta basada en el recuento de caloras (Calorie Counting Diet) Una dieta basada en el recuento de caloras requiere que consuma la cantidad de caloras que usted necesita durante Medical laboratory scientific officer. Las caloras son la medida de la energa que se obtienen de los alimentos que consumimos. Ingerir la cantidad [place] de caloras es importante para Pharmacologist un peso saludable. Si ingiere demasiadas caloras, el organismo las Delphi y aumentar de Lyman. Si ingiere pocas caloras, perder peso. El recuento de las caloras que se ingieren durante Designer, fashion/clothing si recibe la cantidad Geologist, engineering. Un nutricionista matriculado podr determinar cuntas caloras necesita por da. La cantidad de caloras necesarias vara de Neomia Dear persona a otra. Si su objetivo es perder peso deber consumir menos caloras. Si tiene sobrepeso o tiene problemas de [employer] cardacas, hipertensin arterial o diabetes, la prdida de algunos kilos lo beneficiar. Si su objetivo es aumentar de peso deber consumir ms caloras. Si tiene problemas de salud que requieran que usted tenga ms Renningers, puede ser necesario que aumente de Butters. CONSEJOS Ya sea que aumente o disminuya el nmero de caloras que consume [employer], puede ser difcil acostumbrarse a Multimedia programmer los hbitos de lo que come o bebe. Los siguientes consejos lo ayudarn a Midwife un control del nmero de caloras que consume.  La medicin de los [employer] hogar, con una jarra medidora lo ayudar a saber la cantidad real de alimentos y caloras que consume.  En los restaurantes se sirven alimentos en diferentes porciones y tamaos. Es frecuente que en los restaurantes se sirvan los alimentos en cantidades que puedan dividirse en 2 porciones o ms. Al salir a Hotel manager, puede ser til estimar cuntas porciones le sirven. Por ejemplo, una porcin a arroz cocido es de  taza y tiene el tamao de la mitad de un puo. Conocer el tamao de las porciones le ayudar a tener una mejor idea de la cantidad de comida que come en un restaurante.  Pida porciones pequeas o solicite las porciones para nios.  Planifique comer la mitad de una porcin en el restaurante y lleve el resto a su hogar o comprtala con un amigo.  Lea las etiquetas para conocer el contenido de caloras y el tamao de las porciones. La mayora de los alimentos envasados tiene la informacin nutricional en algn lugar del envase. Aqu podr encontrar cuntas porciones contiene el envase, el tamao de la porcin y el nmero de caloras de [place].  Por ejemplo, digamos que un envase contiene tres galletitas. En la Informacin Nutricional se [place] que una porcin corresponde a Financial controller. Debajo, dice que hay tres porciones en  el envase. En la seccin de las caloras se informa que contiene 90 caloras. Esto significa que cada galletita tiene 90 caloras. Si come una galletita, habr consumido 90 caloras. Si come tres galletitas, habr consumido tres veces esa cantidad, o sea 270 caloras. La lista que sigue le mostrar el tamao de algunas porciones comunes.   1 onza.................4 dados apilados  3 onzas..............Marland KitchenUn mazo de cartas  1 cucharadita...Marland KitchenMarland KitchenLa punta de un dedo pequeo  1 cucharada.......Marland KitchenUn dedo  2 cucharadas....Marland KitchenMarland KitchenUna pelota de golf   taza..................la mitad de un puo  1 taza..................Marland KitchenUn puo LLEVE UN REGISTRO DE LO QUE COME Escriba todo lo que come, las cantidades y Medical illustrator de Advertising copywriter en cada comida que haga Administrator. Al final o durante el da puede ir sumando la cantidad de caloras que ha ingerido. Puede ser de utilidad crear Neomia Dear lista como la que se [place] a continuacin. Conozca la informacin sobre caloras [employer]. Desayuno  Salvado (1 taza, 110 caloras).  Leche descremada ( taza, 45 caloras). Colacin  Manzana (1 mediana, 80 caloras). Almuerzo  [place] (1 taza, 20 caloras).  Tomate ( mediana, 20 caloras).  Pollo, pechuga (3 onzas, 165 caloras).  Queso shredded cheddar ( taza, 110 calories).  Aderezo italiano diettico (2 cucharadas, 60 caloras).  Pan de trigo integral (1 rebanada, 80 caloras).  Margarina (1 cucharada, 35 caloras).  Sopa de vegetales (1 taza, 160 caloras). Cena  Chuleta de cerdo (3 onzas, 190 caloras).  Arroz marrn (1 taza, 215 caloras).  Brcoli hervido ( cup, 20 calories).  Jinny Sanders (1  taza, 65 caloras).  Crema batida (1 cucharada, 50 caloras). Total de caloras diarias: 1425 Document Released: 09/12/2008 Document Revised: 08/19/2011 [facility] Patient Information 2013 [place], [place].

## 2012-05-21 NOTE — Progress Notes (Signed)
Stacy Snyder 41 y.o.  Body mass index is 47.26 kg/(m^2).  Patient Active Problem List  Diagnosis  . Seasonal allergies  . HSV-1 (herpes simplex virus 1) infection  . GAD (generalized anxiety disorder)  . Body mass index (BMI) of 45.0-49.9 in adult  . H/O gastric bypass  . Vitamin D deficiency  . Chronic pain of both shoulders  . Impingement syndrome of both shoulders  . 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 Septra (Bactrim)   . Sulfa Antibiotics     Septra     Past Surgical History  Procedure Date  . Cesarean section     X2  . Tubal ligation   . Gastric bypass 2005   DAUB, STEVE A, MD 1. 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  Today's weight is 267 which is down 14 lbs from her preop weight.  Her max weight was about 100 lbs.    She has been having problems with her leg swelling. This started abruptly in September. [name] has been treating this with diuretics and she has had Doppler study to rule out DVT. She reports she went to early menopause some 6 years ago and sees [name] Fontaine for this.  She's not had a pelvic ultrasound or CT. I was questioning whether she might have developed an ovarian mass or something in her abdomen it wouldn't increase her abdominal girth and abdominal pressure. It also like for her to see Huntley Dec Himmelrich and also get a CT scan of her abdomen and pelvis. I'll see her back after that. Matt B. Daphine Deutscher, MD, [facility] [phone] beeper [phone]  05/21/2012 10:15 AM

## 2012-05-21 NOTE — Telephone Encounter (Signed)
Called pt to let her know that I scheduled her CT with [facility] for 12/16 @ 345p as well as her follow up appt with MM on 01/03 @ 12p.

## 2012-05-25 ENCOUNTER — Ambulatory Visit
Admission: RE | Admit: 2012-05-25 | Discharge: 2012-05-25 | Disposition: A | Discharge status: Home / Self Care | Payer: BC Managed Care – PPO | Source: Ambulatory Visit | Attending: Surgery | Admitting: Surgery

## 2012-05-25 DIAGNOSIS — M7989 Other specified soft tissue disorders: Secondary | ICD-10-CM

## 2012-05-25 MED ORDER — IOHEXOL 300 MG/ML  SOLN
125.0000 mL | Freq: Once | INTRAMUSCULAR | Status: AC | PRN
  Administered 2012-05-25: 125 mL via INTRAVENOUS

## 2012-05-28 ENCOUNTER — Other Ambulatory Visit: Payer: Self-pay | Admitting: Gynecology

## 2012-05-28 ENCOUNTER — Ambulatory Visit (INDEPENDENT_AMBULATORY_CARE_PROVIDER_SITE_OTHER): Payer: BC Managed Care – PPO

## 2012-05-28 ENCOUNTER — Encounter: Payer: Self-pay | Admitting: Gynecology

## 2012-05-28 ENCOUNTER — Ambulatory Visit (INDEPENDENT_AMBULATORY_CARE_PROVIDER_SITE_OTHER): Payer: BC Managed Care – PPO | Admitting: Gynecology

## 2012-05-28 DIAGNOSIS — N950 Postmenopausal bleeding: Secondary | ICD-10-CM

## 2012-05-28 DIAGNOSIS — A6000 Herpesviral infection of urogenital system, unspecified: Secondary | ICD-10-CM

## 2012-05-28 DIAGNOSIS — N920 Excessive and frequent menstruation with regular cycle: Secondary | ICD-10-CM

## 2012-05-28 DIAGNOSIS — Z79890 Hormone replacement therapy (postmenopausal): Secondary | ICD-10-CM

## 2012-05-28 DIAGNOSIS — D259 Leiomyoma of uterus, unspecified: Secondary | ICD-10-CM

## 2012-05-28 MED ORDER — VALTREX 1 G PO TABS: 500.0000 mg | ORAL_TABLET | Freq: Every day | ORAL | Status: DC

## 2012-05-28 MED ORDER — ESTRADIOL-NORETHINDRONE ACET 1-0.5 MG PO TABS: 1.0000 | ORAL_TABLET | Freq: Every day | ORAL | Status: DC

## 2012-05-28 NOTE — Patient Instructions (Signed)
Start on Activella as we discussed. Office will call you with biopsy results. If irregular bleeding continues call the office.

## 2012-05-28 NOTE — Progress Notes (Signed)
Patient presents for sonohysterogram due to persistent bleeding after initiation of HRT including MiniVivelle patch and Prometrium.    Ultrasound shows uterine size normal with several small myomas the largest 14 mm. Endometrial echo 8.5 mm. Right left ovary is visualized and normal. Cul-de-sac negative. Sonohysterogram performed sterile technique easy catheter introduction good distention with no abnormalities. Endometrial sample taken, patient tolerated well.  Assessment and plan: Premature menopause with menopausal symptoms. Initiated HRT to include patch and Prometrium with breakthrough bleeding since then. Increased Prometrium but still is having irregular bleeding. She had been on Activella in the past and did well with this and we'll go ahead and restart that now. We discussed the risks to include stroke heart attack DVT breast cancer all which is understood and accepted.   Patient also asked for refill for Valtrex d.a.w. 1 g tablets half a pill daily for HSV suppression

## 2012-06-04 ENCOUNTER — Telehealth: Payer: Self-pay | Admitting: *Deleted

## 2012-06-04 NOTE — Telephone Encounter (Signed)
Katie pharmacist called from [facility] to clarify which rx for Valtrex d.a.w. 1 g tablets half a pill daily for HSV suppression. I told katie MD would like this rx to be filled per OV 05/28/12.

## 2012-06-12 ENCOUNTER — Ambulatory Visit (INDEPENDENT_AMBULATORY_CARE_PROVIDER_SITE_OTHER): Payer: BC Managed Care – PPO | Admitting: Surgery

## 2012-06-12 ENCOUNTER — Encounter (INDEPENDENT_AMBULATORY_CARE_PROVIDER_SITE_OTHER): Payer: Self-pay | Admitting: Surgery

## 2012-06-12 VITALS — BP 126/80 | HR 64 | Temp 98.2°F | Resp 16 | Ht 63.0 in | Wt 266.0 lb

## 2012-06-12 DIAGNOSIS — E669 Obesity, unspecified: Secondary | ICD-10-CM

## 2012-06-12 NOTE — Patient Instructions (Signed)
Calorie Counting Diet A calorie counting diet requires you to eat the number of calories that are right for you in a day. Calories are the measurement of how much energy you get from the food you eat. Eating the right amount of calories is important for staying at a healthy weight. If you eat too many calories, your body will store them as fat and you may gain weight. If you eat too few calories, you may lose weight. Counting the number of calories you eat during a day will help you know if you are eating the right amount. A Registered Dietitian can determine how many calories you need in a day. The amount of calories needed varies from person to person. If your goal is to lose weight, you will need to eat fewer calories. Losing weight can benefit you if you are overweight or have health problems such as heart disease, high blood pressure, or diabetes. If your goal is to gain weight, you will need to eat more calories. Gaining weight may be necessary if you have a certain health problem that causes your body to need more energy. TIPS Whether you are increasing or decreasing the number of calories you eat during a day, it may be hard to get used to changes in what you eat and drink. The following are tips to help you keep track of the number of calories you eat.  Measure foods at home with measuring cups. This helps you know the amount of food and number of calories you are eating.  Restaurants often serve food in amounts that are larger than 1 serving. While eating out, estimate how many servings of a food you are given. For example, a serving of cooked rice is  cup or about the size of half of a fist. Knowing serving sizes will help you be aware of how much food you are eating at restaurants.  Ask for smaller portion sizes or child-size portions at restaurants.  Plan to eat half of a meal at a restaurant. Take the rest home or share the other half with a friend.  Read the Nutrition Facts panel on  food labels for calorie content and serving size. You can find out how many servings are in a package, the size of a serving, and the number of calories each serving has.  For example, a package might contain 3 cookies. The Nutrition Facts panel on that package says that 1 serving is 1 cookie. Below that, it will say there are 3 servings in the container. The calories section of the Nutrition Facts label says there are 90 calories. This means there are 90 calories in 1 cookie (1 serving). If you eat 1 cookie you have eaten 90 calories. If you eat all 3 cookies, you have eaten 270 calories (3 servings x 90 calories = 270 calories). The list below tells you how big or small some common portion sizes are.  1 oz.........4 stacked dice.  3 oz.........Deck of cards.  1 tsp........Tip of little finger.  1 tbs........Thumb.  2 tbs........Golf ball.   cup.......Half of a fist.  1 cup........A fist. KEEP A FOOD LOG Write down every food item you eat, the amount you eat, and the number of calories in each food you eat during the day. At the end of the day, you can add up the total number of calories you have eaten. It may help to keep a list like the one below. Find out the calorie information by reading the   Nutrition Facts panel on food labels. Breakfast  Bran cereal (1 cup, 110 calories).  Fat-free milk ( cup, 45 calories). Snack  Apple (1 medium, 80 calories). Lunch  Spinach (1 cup, 20 calories).  Tomato ( medium, 20 calories).  Chicken breast strips (3 oz, 165 calories).  Shredded cheddar cheese ( cup, 110 calories).  Light [place] dressing (2 tbs, 60 calories).  Whole-wheat bread (1 slice, 80 calories).  Tub margarine (1 tsp, 35 calories).  Vegetable soup (1 cup, 160 calories). Dinner  Pork chop (3 oz, 190 calories).  Brown rice (1 cup, 215 calories).  Steamed broccoli ( cup, 20 calories).  Strawberries (1  cup, 65 calories).  Whipped cream (1 tbs, 50  calories). Daily Calorie Total: 1425 Document Released: 05/27/2005 Document Revised: 08/19/2011 Document Reviewed: 11/21/2006 [facility] Patient Information 2013 [place], [place].

## 2012-06-12 NOTE — Progress Notes (Signed)
Stacy Snyder 42 y.o.  Body mass index is 47.12 kg/(m^2).  Patient Active Problem List  Diagnosis  . Seasonal allergies  . HSV-1 (herpes simplex virus 1) infection  . GAD (generalized anxiety disorder)  . Body mass index (BMI) of 45.0-49.9 in adult  . H/O gastric bypass  . Vitamin D deficiency  . Chronic pain of both shoulders  . Impingement syndrome of both shoulders  . 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 Septra (Bactrim)   . Sulfa Antibiotics     Septra     Past Surgical History  Procedure Date  . Cesarean section     X2  . Tubal ligation   . Gastric bypass 2005   DAUB, STEVE A, MD No diagnosis found.  Stacy Snyder comes she is caring quadrant of weight. I talked her about seeing Stacy Snyder today for review of her CT scan.  No source for her leg swelling was seen.  I want her to see Stacy Snyder for consultation to see if she can get her in her choices of food. I think with we can get her back to more of a protein base diet and we can get her weight loss contract. I will see her back after her appointment with Stacy Snyder B. Daphine Deutscher, MD, [facility] [phone] beeper [phone]  06/12/2012 12:26 PM

## 2012-06-20 ENCOUNTER — Encounter: Payer: BC Managed Care – PPO | Attending: Surgery | Admitting: *Deleted

## 2012-06-20 ENCOUNTER — Encounter: Payer: Self-pay | Admitting: *Deleted

## 2012-06-20 VITALS — Ht 63.0 in | Wt 264.5 lb

## 2012-06-20 DIAGNOSIS — Z713 Dietary counseling and surveillance: Secondary | ICD-10-CM | POA: Insufficient documentation

## 2012-06-20 DIAGNOSIS — Z09 Encounter for follow-up examination after completed treatment for conditions other than malignant neoplasm: Secondary | ICD-10-CM | POA: Insufficient documentation

## 2012-06-20 DIAGNOSIS — Z9884 Bariatric surgery status: Secondary | ICD-10-CM | POA: Insufficient documentation

## 2012-06-20 DIAGNOSIS — Z6841 Body Mass Index (BMI) 40.0 and over, adult: Secondary | ICD-10-CM

## 2012-06-20 NOTE — Progress Notes (Signed)
  Follow-up visit:  8 Years Post-Operative RYGB Surgery  Medical Nutrition Therapy:  Appt start time: 1500   End time:  1600.  Primary concerns today: Post-operative Bariatric Surgery Nutrition Management. Stacy Snyder is here today 8 yrs s/p RYGB to re-establish care at [facility]. Reports lowest wt of 165-170 lbs in 2009-10;  started gaining wt when going through menopause. Not currently exercising and reports she is unable to tolerate dairy (except cheese). Will do further analysis on her extended food recall once sent to me. Currently drinking with meals and some high CHO meals and meal skipping.  Surgery date:  01/10/04 Surgery type:  RYGB Start weight (patient reported):  282 lbs Last weight (patient reported):  165-170 lbs   Weight today:  264.5 lbs Total weight lost:  17.5 lbs BMI:  46.8  24-hr recall: B (AM): 1 scrambled egg w/ mushroom, 1 tbsp pimento cheese, and 1 pc bacon OR 1 pc whole wheat toast w/ peanut butter (5-10g) Snk (AM): beef jerky (12g pro) OR 1/4 cup pumpkin seeds (14g pro; 26g fat) L (PM): 1/3 Chipotle rice bowl with sm amt brown rice, black beans, cheese, chicken, corn salsa, and a little sour cream (15-20g pro; 8-10g fat; 15g CHO; 250 cal) Snk (PM): NONE  D (PM): Caesar salad w/ 2-3 oz steak and sauteed mushrooms - no croutons (25g; ~ 5g fat) OR none (not usual) Snk (PM): Few crackers w/ cheese (5g)  Fluid intake:  2-3 (32 oz) bottles water = 64-100 oz Estimated total protein intake:  60-70g  Medications: See medication list Supplementation:  2 MVI,  2 doses of 500 mg calcium citrate  Using straws: No Drinking while eating:  Some Hair loss: No Carbonated beverages: Flat diet Mtn Dew "once in a while" N/V/D/C: None Dumping syndrome: None  Recent physical activity:  None at this time  Progress Towards Goal(s):  In progress.  Handouts given during visit include:  Bariatric Surgery Specialized Post-Op Diet   Nutritional Diagnosis:  [place]-3.3 Overweight/obesity  related to poor food choices s/p RYGB surgery as evidenced by patient reported food recall and a weight gain of 95-100 lbs over the last 3 years.    Intervention:  Nutrition education.  Monitoring/Evaluation:  Dietary intake, exercise, and body weight. Follow up in 6 weeks or prn.

## 2012-06-20 NOTE — Patient Instructions (Addendum)
Goals:  Follow dietary guidelines in handout  Avoid meal skipping  Increase lean protein foods to meet 60-80g goal  Avoid drinking 15 minutes before, during, and 30 minutes after eating  Aim for >30 min of physical activity daily  Increase calcium citrate to 500 mg 3 times daily. (See handout)  Track food intake for 2 weekdays and at least 1 weekend day - email to me.

## 2012-06-24 ENCOUNTER — Encounter (INDEPENDENT_AMBULATORY_CARE_PROVIDER_SITE_OTHER): Payer: BC Managed Care – PPO | Admitting: Surgery

## 2012-06-24 ENCOUNTER — Telehealth (INDEPENDENT_AMBULATORY_CARE_PROVIDER_SITE_OTHER): Payer: Self-pay

## 2012-06-24 NOTE — Telephone Encounter (Signed)
Called pt to reschedule her appt from 115 @ 310p to 116 @ 1050a.  Pt was happy that I called and more than willing to r/s

## 2012-06-25 ENCOUNTER — Encounter (INDEPENDENT_AMBULATORY_CARE_PROVIDER_SITE_OTHER): Payer: BC Managed Care – PPO | Admitting: Surgery

## 2012-06-26 ENCOUNTER — Telehealth (INDEPENDENT_AMBULATORY_CARE_PROVIDER_SITE_OTHER): Payer: Self-pay

## 2012-06-26 NOTE — Telephone Encounter (Signed)
Called pt in regards to her new appt date and time - Fri, Jan 31 @240p .  Pt stated that this appt was to speak with MM about her recent appt with Huntley Dec @ Nutrition Services.  Pt states having another appt with Huntley Dec on Feb 8th, and thinks MM is going to want to speak with her again after that visit.  Pt and I agreed.  I will cancel her appt for the 31st and work on getting her an appt after her 2/8 visit.

## 2012-06-29 ENCOUNTER — Telehealth: Payer: Self-pay | Admitting: *Deleted

## 2012-06-29 MED ORDER — MEGESTROL ACETATE 20 MG PO TABS: 20.0000 mg | ORAL_TABLET | Freq: Every day | ORAL | Status: DC

## 2012-06-29 NOTE — Telephone Encounter (Signed)
Tell patient not to worry we do not need to do the sonohysterogram again. I would recommend continuing on the Activella and adding Megace 20 mg daily for 10 days to see if we can't stop the bleeding but continue on the Activella regardless and we'll see what she does. Sometimes it will take several weeks for the bleeding to totally go away but obviously I do not want her bleeding heavily during this time.

## 2012-06-29 NOTE — Telephone Encounter (Signed)
Pt was started back on  Activella in Dec. 2013, pt said that she has started back bleeding again. Started on Jan 16 and has continued. Pt was told to follow up if bleeding should persist, pt made it very clear to me that she doesn't want to have another SHGM. Please advise

## 2012-06-30 NOTE — Telephone Encounter (Signed)
Left message for pt to call.

## 2012-06-30 NOTE — Telephone Encounter (Signed)
Pt informed with the below note. 

## 2012-07-10 ENCOUNTER — Encounter (INDEPENDENT_AMBULATORY_CARE_PROVIDER_SITE_OTHER): Payer: BC Managed Care – PPO | Admitting: Surgery

## 2012-07-13 ENCOUNTER — Other Ambulatory Visit: Payer: Self-pay | Admitting: Gynecology

## 2012-07-15 ENCOUNTER — Encounter (INDEPENDENT_AMBULATORY_CARE_PROVIDER_SITE_OTHER): Payer: Self-pay | Admitting: Surgery

## 2012-07-18 ENCOUNTER — Encounter: Payer: BC Managed Care – PPO | Attending: Surgery | Admitting: *Deleted

## 2012-07-18 ENCOUNTER — Encounter: Payer: Self-pay | Admitting: *Deleted

## 2012-07-18 DIAGNOSIS — Z713 Dietary counseling and surveillance: Secondary | ICD-10-CM | POA: Insufficient documentation

## 2012-07-18 DIAGNOSIS — Z9884 Bariatric surgery status: Secondary | ICD-10-CM | POA: Insufficient documentation

## 2012-07-18 DIAGNOSIS — Z09 Encounter for follow-up examination after completed treatment for conditions other than malignant neoplasm: Secondary | ICD-10-CM | POA: Insufficient documentation

## 2012-07-18 NOTE — Progress Notes (Signed)
  Follow-up visit:  8 Years Post-Operative RYGB Surgery  Medical Nutrition Therapy:  Appt start time: 1500   End time:  1600.  Primary concerns today: Post-operative Bariatric Surgery Nutrition Management. Stacy Snyder is here today for f/u with 5.5 lb wt loss. Food recall analysis reveals she is consuming almost 60% of her calories from fat, primarily from mayonnaise, peanut butter, and pumpkin seeds. Discussed other options she can use.  Not exercising d/t new job she started on 06/29/12 in [place], which has increased her stress.  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   Surgery date:  01/10/04 Surgery type:  RYGB Start weight (patient reported):  282 lbs Last weight (patient reported):  165-170 lbs   Weight today:  259.0 lbs Weight change:  5.5 lb LOSS Total weight lost:  23.0 lbs   TANITA  BODY COMP RESULTS  06/20/12 07/18/12   BMI (kg/m^2) 46.9 45.9   Fat Mass (lbs) -- 139.5   Fat Free Mass (lbs) -- 119.5   Total Body Water (lbs) -- 87.5   24-hr recall: B (AM): Protein bar (10g), 1 pc whole wheat toast w/ peanut butter (5-10g) OR 1 fried egg w/ cheese in pita pocket Snk (AM): NONE L (PM): 2 oz deli meat with (1/4 c) cottage cheese and homemade tomato sauce Snk (PM): 1/4 c pumpkin seeds  D (PM): Stir fry chicken with peppers and onions OR chicken breast with cheese and tomato sauce Snk (PM): Few crackers w/ cheese (5g)  Fluid intake:  3-4 (32 oz) bottles water = 64-100 oz Estimated total protein intake:  60-80g  Medications: See medication list Supplementation:  2 MVI,  2 doses of 500 mg calcium citrate  Using straws: No Drinking while eating:  Some Hair loss: No Carbonated beverages: Flat diet Mtn Dew "once in a while" N/V/D/C: None Dumping syndrome: None  Recent physical activity:  None at this time. Works 8am - 5pm in [place], so little time for it.  Wants to join a gym near her work so she can start swimming. Will have to be after adjusting to new  job.   Progress Towards Goal(s):  In progress.   Nutritional Diagnosis:  [place]-3.3 Overweight/obesity related to poor food choices s/p RYGB surgery as evidenced by patient reported food recall and a weight gain of 95-100 lbs over the last 3 years.    Intervention:  Nutrition education.  Monitoring/Evaluation:  Dietary intake, exercise, and body weight. Follow up in 4 weeks or prn.

## 2012-07-18 NOTE — Patient Instructions (Addendum)
Goals:  Follow dietary guidelines in handout  Avoid meal skipping  Increase lean protein foods to meet 60-80g goal  Avoid drinking 15 minutes before, during, and 30 minutes after eating  Aim for >30 min of physical activity daily  Increase calcium citrate to 500 mg 3 times daily. (See handout)  Watch amount of fat in meals (ex: mayo, pumpkin seeds)

## 2012-07-23 ENCOUNTER — Encounter (INDEPENDENT_AMBULATORY_CARE_PROVIDER_SITE_OTHER): Payer: BC Managed Care – PPO | Admitting: Surgery

## 2012-07-24 ENCOUNTER — Telehealth (INDEPENDENT_AMBULATORY_CARE_PROVIDER_SITE_OTHER): Payer: Self-pay

## 2012-07-24 NOTE — Telephone Encounter (Signed)
LMOM - DID NOT SPEAK WITH PT

## 2012-07-24 NOTE — Telephone Encounter (Signed)
Spoke with pt, giving her her new appt date and time, set for Tuesday, March 4th @ 330p

## 2012-07-26 ENCOUNTER — Other Ambulatory Visit: Payer: Self-pay

## 2012-08-03 NOTE — Telephone Encounter (Signed)
ERROR

## 2012-08-11 ENCOUNTER — Encounter: Payer: Self-pay | Admitting: *Deleted

## 2012-08-11 ENCOUNTER — Ambulatory Visit (INDEPENDENT_AMBULATORY_CARE_PROVIDER_SITE_OTHER): Payer: BC Managed Care – PPO | Admitting: Surgery

## 2012-08-11 ENCOUNTER — Encounter (INDEPENDENT_AMBULATORY_CARE_PROVIDER_SITE_OTHER): Payer: Self-pay | Admitting: Surgery

## 2012-08-11 VITALS — BP 128/70 | HR 60 | Temp 98.3°F | Resp 18 | Ht 63.0 in | Wt 250.5 lb

## 2012-08-11 DIAGNOSIS — Z9884 Bariatric surgery status: Secondary | ICD-10-CM

## 2012-08-11 NOTE — Progress Notes (Signed)
Stacy Snyder 42 y.o.  Body mass index is 44.39 kg/(m^2).  Patient Active Problem List  Diagnosis  . Seasonal allergies  . HSV-1 (herpes simplex virus 1) infection  . GAD (generalized anxiety disorder)  . Body mass index (BMI) of 45.0-49.9 in adult  . H/O gastric bypass  . Vitamin D deficiency  . Chronic pain of both shoulders  . Impingement syndrome of both shoulders  . 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 Septra (Bactrim)   . Sulfa Antibiotics     Septra     Past Surgical History  Procedure Laterality Date  . Cesarean section      X2  . Tubal ligation    . Gastric bypass  2005   DAUB, STEVE A, MD No diagnosis found.  Follow up visit.  Stacy Snyder is seeing Maralyn Sago and is having good results.  She is watching her car and intake as well as her fat intake. She's also drinking protein shakes in the morning. However the swelling in her legs is going down and she's lost about 30 pounds since we did this discussion in December. She's making very good progress and I will see her back in 3 months. She's following Sarah's lead  on some dietary interventions.  Matt B. Daphine Deutscher, MD, [facility] [phone] beeper [phone]  08/11/2012 4:47 PM

## 2012-08-11 NOTE — Patient Instructions (Signed)
-  Will see back in 3 months 

## 2012-08-12 NOTE — Progress Notes (Signed)
Stacy Snyder returns for repeat Tanita scan...  Weight today:  251.5 lbs Weight change:  7.5 lb LOSS Total weight lost:  30.5 lbs   TANITA  BODY COMP RESULTS  06/20/12 07/18/12 08/11/12   BMI (kg/m^2) 46.9 45.9 44.6   Fat Mass (lbs) -- 139.5 133.5   Fat Free Mass (lbs) -- 119.5 118.0   Total Body Water (lbs) -- 87.5 86.5   Has lost 6 lbs of FAT MASS in 4 weeks!!!

## 2012-08-15 ENCOUNTER — Encounter: Payer: BC Managed Care – PPO | Attending: Surgery | Admitting: *Deleted

## 2012-08-15 DIAGNOSIS — Z9884 Bariatric surgery status: Secondary | ICD-10-CM | POA: Insufficient documentation

## 2012-08-15 DIAGNOSIS — Z09 Encounter for follow-up examination after completed treatment for conditions other than malignant neoplasm: Secondary | ICD-10-CM | POA: Insufficient documentation

## 2012-08-15 DIAGNOSIS — Z713 Dietary counseling and surveillance: Secondary | ICD-10-CM | POA: Insufficient documentation

## 2012-11-11 ENCOUNTER — Ambulatory Visit (INDEPENDENT_AMBULATORY_CARE_PROVIDER_SITE_OTHER): Payer: BC Managed Care – PPO | Admitting: Surgery

## 2013-04-14 ENCOUNTER — Other Ambulatory Visit: Payer: Self-pay

## 2013-04-14 DIAGNOSIS — Z1231 Encounter for screening mammogram for malignant neoplasm of breast: Secondary | ICD-10-CM

## 2013-04-15 ENCOUNTER — Other Ambulatory Visit: Payer: Self-pay

## 2013-04-19 ENCOUNTER — Ambulatory Visit (INDEPENDENT_AMBULATORY_CARE_PROVIDER_SITE_OTHER): Payer: BC Managed Care – PPO | Admitting: Gynecology

## 2013-04-19 ENCOUNTER — Encounter: Payer: Self-pay | Admitting: Gynecology

## 2013-04-19 VITALS — BP 124/80 | Ht 63.0 in | Wt 257.0 lb

## 2013-04-19 DIAGNOSIS — Z79890 Hormone replacement therapy (postmenopausal): Secondary | ICD-10-CM

## 2013-04-19 DIAGNOSIS — Z01419 Encounter for gynecological examination (general) (routine) without abnormal findings: Secondary | ICD-10-CM

## 2013-04-19 DIAGNOSIS — Z23 Encounter for immunization: Secondary | ICD-10-CM

## 2013-04-19 DIAGNOSIS — E559 Vitamin D deficiency, unspecified: Secondary | ICD-10-CM

## 2013-04-19 LAB — COMPREHENSIVE METABOLIC PANEL
ALT: 13 U/L (ref 0–35)
AST: 19 U/L (ref 0–37)
Creat: 0.81 mg/dL (ref 0.50–1.10)
Total Bilirubin: 0.4 mg/dL (ref 0.3–1.2)

## 2013-04-19 LAB — LIPID PANEL
Cholesterol: 196 mg/dL (ref 0–200)
HDL: 63 mg/dL (ref >39)
Total CHOL/HDL Ratio: 3.1 Ratio
VLDL: 25 mg/dL (ref 0–40)

## 2013-04-19 LAB — CBC WITH DIFFERENTIAL/PLATELET
Basophils Absolute: 0 10*3/uL (ref 0.0–0.1)
Eosinophils Absolute: 0.1 10*3/uL (ref 0.0–0.7)
Eosinophils Relative: 1 % (ref 0–5)
MCH: 30.5 pg (ref 26.0–34.0)
MCHC: 34.3 g/dL (ref 30.0–36.0)
MCV: 88.9 fL (ref 78.0–100.0)
Platelets: 355 10*3/uL (ref 150–400)
RDW: 13.5 % (ref 11.5–15.5)

## 2013-04-19 LAB — TSH: TSH: 3.331 u[IU]/mL (ref 0.350–4.500)

## 2013-04-19 MED ORDER — ESTRADIOL 0.5 MG PO TABS: 0.5000 mg | ORAL_TABLET | Freq: Every day | ORAL | Status: DC

## 2013-04-19 MED ORDER — FLUOXETINE HCL 20 MG PO CAPS: ORAL_CAPSULE | ORAL | Status: DC

## 2013-04-19 MED ORDER — PROGESTERONE MICRONIZED 100 MG PO CAPS: 100.0000 mg | ORAL_CAPSULE | Freq: Every day | ORAL | Status: DC

## 2013-04-19 NOTE — Addendum Note (Signed)
Addended by: Dayna Barker on: 04/19/2013 10:40 AM   Modules accepted: Orders

## 2013-04-19 NOTE — Progress Notes (Signed)
[name] [birth year] [id number]        42 y.o.  G2P2 for annual exam.  Several issues noted below.  Past medical history,surgical history, problem list, medications, allergies, family history and social history were all reviewed and documented in the EPIC chart.  ROS:  Performed and pertinent positives and negatives are included in the history, assessment and plan .  Exam: Kim assistant Filed Vitals:   04/19/13 0939  BP: 124/80  Height: 5\' 3"  (1.6 m)  Weight: 257 lb (116.574 kg)   General appearance  Normal Skin grossly normal Head/Neck normal with no cervical or supraclavicular adenopathy thyroid normal Lungs  clear Cardiac RR, without RMG Abdominal  soft, nontender, without masses, organomegaly or hernia Breasts  examined lying and sitting without masses, retractions, discharge or axillary adenopathy. Pelvic  Ext/BUS/vagina  normal  Cervix  normal  Uterus  anteverted, normal size, shape and contour, midline and mobile nontender   Adnexa  Without masses or tenderness    Anus and perineum  normal   Rectovaginal  normal sphincter tone without palpated masses or tenderness.    Assessment/Plan:  42 y.o. G2P2 female for annual exam, amenorrheic, tubal sterilization.   1. History premature menopause. Had been on HRT on and off. Had irregular bleeding last year underwent negative sonohysterogram and benign endometrial biopsy. She ultimately stopped her HRT due to the bleeding and has done no further bleeding. Not having significant hot flashes or night sweats. I again reviewed the whole issue of HRT with her possible benefits from a cardiovascular and bone health standpoint for ERT patients with premature menopause. The risks of stroke heart attack DVT and breast cancer reviewed. Ultimately we decided for him another trial with estradiol 0.5 mg and Prometrium 100 mg at bedtime. Refill x1 year. She'll call me if she has any issues to include bleeding or side effects. 2. Pap smear 2012.  No Pap smear done today. No history of significant abnormal Pap smears. Plan repeat Pap smear next year a 3 year interval. 3. Mammography 2013. Patient has scheduled in December. Will continue with annual mammography. SBE monthly reviewed. 4. Obesity. Patient actively seeing a dietitian. We'll continue to work on her obesity. 5. Vitamin D deficiency. History of marginal vitamin D in the past.  values 30 last year. Recheck vitamin D today.  6. History HSV outbreaks. Not taking prophylactic Valtrex as in the past. Not having outbreaks at this time. Will call if she needs to refill her medication and restart if she starts having outbreaks again.  7. Anxiety. On fluoxetine 20 mg daily doing well wants to continue. I refilled her x1 year. Does not want to consider stopping at this time.  8. Health maintenance. Baseline CBC comprehensive metabolic panel lipid profile urinalysis vitamin D TSH ordered. Followup one year, sooner as needed.    Note: This document was prepared with digital dictation and possible smart phrase technology. Any transcriptional errors that result from this process are unintentional.   Dara Lords MD, 10:06 AM 04/19/2013

## 2013-04-19 NOTE — Patient Instructions (Signed)
Followup in one year for annual exam. Followup for mammogram as scheduled. Call me if any issues with the hormone replacement therapy.

## 2013-04-20 LAB — URINALYSIS W MICROSCOPIC + REFLEX CULTURE
Bacteria, UA: NONE SEEN
Bilirubin Urine: NEGATIVE
Hgb urine dipstick: NEGATIVE
Protein, ur: NEGATIVE mg/dL
Squamous Epithelial / LPF: NONE SEEN
Urobilinogen, UA: 1 mg/dL (ref 0.0–1.0)

## 2013-04-20 LAB — VITAMIN D 25 HYDROXY (VIT D DEFICIENCY, FRACTURES): Vit D, 25-Hydroxy: 30 ng/mL (ref 30–89)

## 2013-05-18 ENCOUNTER — Ambulatory Visit
Admission: RE | Admit: 2013-05-18 | Discharge: 2013-05-18 | Disposition: A | Discharge status: Home / Self Care | Payer: BC Managed Care – PPO | Source: Ambulatory Visit

## 2013-05-18 DIAGNOSIS — Z1231 Encounter for screening mammogram for malignant neoplasm of breast: Secondary | ICD-10-CM

## 2013-08-02 ENCOUNTER — Ambulatory Visit (INDEPENDENT_AMBULATORY_CARE_PROVIDER_SITE_OTHER): Payer: BC Managed Care – PPO | Admitting: Emergency Medicine

## 2013-08-02 VITALS — BP 134/88 | HR 86 | Temp 98.7°F | Resp 16 | Ht 63.0 in | Wt 263.2 lb

## 2013-08-02 DIAGNOSIS — J111 Influenza due to unidentified influenza virus with other respiratory manifestations: Secondary | ICD-10-CM

## 2013-08-02 MED ORDER — PSEUDOEPHEDRINE-GUAIFENESIN ER 60-600 MG PO TB12: 1.0000 | ORAL_TABLET | Freq: Two times a day (BID) | ORAL | Status: AC

## 2013-08-02 MED ORDER — PROMETHAZINE-CODEINE 6.25-10 MG/5ML PO SYRP: 5.0000 mL | ORAL_SOLUTION | Freq: Four times a day (QID) | ORAL | Status: DC | PRN

## 2013-08-02 MED ORDER — OSELTAMIVIR PHOSPHATE 75 MG PO CAPS: 75.0000 mg | ORAL_CAPSULE | Freq: Two times a day (BID) | ORAL | Status: DC

## 2013-08-02 NOTE — Progress Notes (Signed)
[facility] [address] [place] [address] [phone]  Date:  08/02/2013   Name:  Stacy Snyder   DOB:  02-26-1971   MRN:  [id number]  PCP:  Leandrew Koyanagi, MD    Chief Complaint: Cough, Generalized Body Aches and Headache   History of Present Illness:  Stacy Snyder is a 43 y.o. very pleasant female patient who presents with the following:  Ill since sudden onset yesterday.  Developed generalized myalgias and arthralgias.  Has a cough that is not productive.  No wheezing or shortness of breath. No nausea or vomiting.  Has no fever but is chilled.  No rash or stool change.  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  No improvement with over the counter medications or other home remedies. Denies other complaint or health concern today.   Patient Active Problem List   Diagnosis Date Noted  . 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 Impingement syndrome of both shoulders 03/09/2012  . GAD (generalized anxiety disorder) 03/01/2012  . Body mass index (BMI) of 45.0-49.9 in adult 03/01/2012  . H/O gastric bypass 03/01/2012  . Vitamin D deficiency 03/01/2012  . Chronic pain of both shoulders 03/01/2012  . Seasonal allergies   . HSV-1 (herpes simplex virus 1) infection     Past Medical History  Diagnosis Date  . HSV-1 (herpes simplex virus 1) infection   . Seasonal allergies   . Arthritis   . Obesity     Past Surgical History  Procedure Laterality Date  . Cesarean section      X2  . Tubal ligation    . Gastric bypass  2005    History  Substance Use Topics  . Smoking status: Never Smoker   . Smokeless tobacco: Never Used  . Alcohol Use: Yes     Comment: socially    Family History  Problem Relation Age of Onset  . Diabetes Maternal Grandmother   . Hypertension Maternal Grandmother   . Heart disease Maternal Grandmother   . Diabetes Paternal Grandmother   . Hypertension Paternal Grandmother   . Heart disease Paternal Grandfather      Allergies  Allergen Reactions  . Septra [Bactrim] Rash  . Sulfa Antibiotics Rash    Septra     Medication list has been reviewed and updated.  Current Outpatient Prescriptions on File Prior to Visit  Medication Sig Dispense Refill  . calcium-vitamin D 250-100 MG-UNIT per tablet Take 1 tablet by mouth 2 (two) times daily.        . Cranberry (SM CRANBERRY) 300 MG tablet Take 300 mg by mouth daily.      Marland Kitchen estradiol (ESTRACE) 0.5 MG tablet Take 1 tablet (0.5 mg total) by mouth daily.  30 tablet  11  . FLUoxetine (PROZAC) 20 MG capsule TAKE 1 CAPSULE EVERY DAY  30 capsule  11  . Multiple Vitamin (MULTIVITAMIN) capsule Take 1 capsule by mouth daily.        . progesterone (PROMETRIUM) 100 MG capsule Take 1 capsule (100 mg total) by mouth at bedtime.  30 capsule  11  . VALTREX 1 G tablet Take 0.5 tablets (500 mg total) by mouth daily.  30 tablet  6   No current facility-administered medications on file prior to visit.    Review of Systems:  As per HPI, otherwise negative.    Physical Examination: Filed Vitals:   08/02/13 0831  BP: 134/88  Pulse: 86  Temp: 98.7  F (37.1 C)  Resp: 16   Filed Vitals:   08/02/13 0831  Height: 5\' 3"  (1.6 m)  Weight: 263 lb 3.2 oz (119.387 kg)   Body mass index is 46.64 kg/(m^2). Ideal Body Weight: Weight in (lb) to have BMI = 25: 140.8  GEN: WDWN, NAD, Non-toxic, A & O x 3 HEENT: Atraumatic, Normocephalic. Neck supple. No masses, No LAD. Ears and Nose: No external deformity. CV: RRR, No M/G/R. No JVD. No thrill. No extra heart sounds. PULM: CTA B, no wheezes, crackles, rhonchi. No retractions. No resp. distress. No accessory muscle use. ABD: S, NT, ND, +BS. No rebound. No HSM. EXTR: No c/c/e NEURO Normal gait.  PSYCH: Normally interactive. Conversant. Not depressed or anxious appearing.  Calm demeanor.    Assessment and Plan: Influenza tamiflu  Signed,  Ellison Carwin, MD

## 2013-08-02 NOTE — Patient Instructions (Signed)

## 2014-05-30 ENCOUNTER — Other Ambulatory Visit: Payer: Self-pay | Admitting: Gynecology

## 2014-06-20 ENCOUNTER — Encounter: Payer: BC Managed Care – PPO | Admitting: Gynecology

## 2014-07-28 ENCOUNTER — Other Ambulatory Visit: Payer: Self-pay

## 2014-07-28 DIAGNOSIS — Z1231 Encounter for screening mammogram for malignant neoplasm of breast: Secondary | ICD-10-CM

## 2014-08-12 ENCOUNTER — Ambulatory Visit
Admission: RE | Admit: 2014-08-12 | Discharge: 2014-08-12 | Disposition: A | Discharge status: Home / Self Care | Payer: BC Managed Care – PPO | Source: Ambulatory Visit

## 2014-08-12 DIAGNOSIS — Z1231 Encounter for screening mammogram for malignant neoplasm of breast: Secondary | ICD-10-CM

## 2014-11-22 ENCOUNTER — Ambulatory Visit (INDEPENDENT_AMBULATORY_CARE_PROVIDER_SITE_OTHER): Payer: BC Managed Care – PPO | Admitting: Gynecology

## 2014-11-22 ENCOUNTER — Other Ambulatory Visit (HOSPITAL_COMMUNITY)
Admission: RE | Admit: 2014-11-22 | Discharge: 2014-11-22 | Disposition: A | Discharge status: Home / Self Care | Payer: BC Managed Care – PPO | Source: Ambulatory Visit | Attending: Gynecology | Admitting: Gynecology

## 2014-11-22 ENCOUNTER — Encounter: Payer: Self-pay | Admitting: Gynecology

## 2014-11-22 VITALS — BP 120/74 | Ht 63.0 in | Wt 279.0 lb

## 2014-11-22 DIAGNOSIS — Z1151 Encounter for screening for human papillomavirus (HPV): Secondary | ICD-10-CM | POA: Insufficient documentation

## 2014-11-22 DIAGNOSIS — N951 Menopausal and female climacteric states: Secondary | ICD-10-CM | POA: Diagnosis not present

## 2014-11-22 DIAGNOSIS — Z79890 Hormone replacement therapy (postmenopausal): Secondary | ICD-10-CM | POA: Diagnosis not present

## 2014-11-22 DIAGNOSIS — Z01419 Encounter for gynecological examination (general) (routine) without abnormal findings: Secondary | ICD-10-CM

## 2014-11-22 LAB — LIPID PANEL
CHOL/HDL RATIO: 3.6 ratio
CHOLESTEROL: 202 mg/dL — AB (ref 0–200)
HDL: 56 mg/dL (ref >=46)
LDL Cholesterol: 117 mg/dL — ABNORMAL HIGH (ref 0–99)
TRIGLYCERIDES: 144 mg/dL (ref <150)
VLDL: 29 mg/dL (ref 0–40)

## 2014-11-22 LAB — CBC WITH DIFFERENTIAL/PLATELET
BASOS ABS: 0 10*3/uL (ref 0.0–0.1)
Basophils Relative: 0 % (ref 0–1)
Eosinophils Absolute: 0.1 10*3/uL (ref 0.0–0.7)
Eosinophils Relative: 1 % (ref 0–5)
HEMATOCRIT: 37.7 % (ref 36.0–46.0)
HEMOGLOBIN: 12.6 g/dL (ref 12.0–15.0)
LYMPHS PCT: 29 % (ref 12–46)
Lymphs Abs: 2.3 10*3/uL (ref 0.7–4.0)
MCH: 28.8 pg (ref 26.0–34.0)
MCHC: 33.4 g/dL (ref 30.0–36.0)
MCV: 86.1 fL (ref 78.0–100.0)
MONOS PCT: 4 % (ref 3–12)
MPV: 9.8 fL (ref 8.6–12.4)
Monocytes Absolute: 0.3 10*3/uL (ref 0.1–1.0)
NEUTROS PCT: 66 % (ref 43–77)
Neutro Abs: 5.3 10*3/uL (ref 1.7–7.7)
Platelets: 397 10*3/uL (ref 150–400)
RBC: 4.38 MIL/uL (ref 3.87–5.11)
RDW: 13.4 % (ref 11.5–15.5)
WBC: 8 10*3/uL (ref 4.0–10.5)

## 2014-11-22 LAB — COMPREHENSIVE METABOLIC PANEL
ALK PHOS: 124 U/L — AB (ref 39–117)
ALT: 19 U/L (ref 0–35)
AST: 24 U/L (ref 0–37)
Albumin: 4 g/dL (ref 3.5–5.2)
BILIRUBIN TOTAL: 0.4 mg/dL (ref 0.2–1.2)
BUN: 12 mg/dL (ref 6–23)
CO2: 28 mEq/L (ref 19–32)
CREATININE: 0.8 mg/dL (ref 0.50–1.10)
Calcium: 9.6 mg/dL (ref 8.4–10.5)
Chloride: 104 mEq/L (ref 96–112)
GLUCOSE: 96 mg/dL (ref 70–99)
Potassium: 4 mEq/L (ref 3.5–5.3)
Sodium: 139 mEq/L (ref 135–145)
Total Protein: 6.9 g/dL (ref 6.0–8.3)

## 2014-11-22 LAB — TSH: TSH: 1.847 u[IU]/mL (ref 0.350–4.500)

## 2014-11-22 MED ORDER — ESTRADIOL 0.5 MG PO TABS: 0.5000 mg | ORAL_TABLET | Freq: Every day | ORAL | Status: DC

## 2014-11-22 MED ORDER — PROGESTERONE MICRONIZED 100 MG PO CAPS: 100.0000 mg | ORAL_CAPSULE | Freq: Every day | ORAL | Status: DC

## 2014-11-22 NOTE — Patient Instructions (Addendum)
Start back on the hormone replacement regiment. Call me if you have any issues with this.  Call me if you have any vaginal bleeding.  You may obtain a copy of any labs that were done today by logging onto MyChart as outlined in the instructions provided with your AVS (after visit summary). The office will not call with normal lab results but certainly if there are any significant abnormalities then we will contact you.   Health Maintenance, Female A healthy lifestyle and preventative care can promote health and wellness.  Maintain regular health, dental, and eye exams.  Eat a healthy diet. Foods like vegetables, fruits, whole grains, low-fat dairy products, and lean protein foods contain the nutrients you need without too many calories. Decrease your intake of foods high in solid fats, added sugars, and salt. Get information about a proper diet from your caregiver, if necessary.  Regular physical exercise is one of the most important things you can do for your health. Most adults should get at least 150 minutes of moderate-intensity exercise (any activity that increases your heart rate and causes you to sweat) each week. In addition, most adults need muscle-strengthening exercises on 2 or more days a week.   Maintain a healthy weight. The body mass index (BMI) is a screening tool to identify possible weight problems. It provides an estimate of body fat based on height and weight. Your caregiver can help determine your BMI, and can help you achieve or maintain a healthy weight. For adults 20 years and older:  A BMI below 18.5 is considered underweight.  A BMI of 18.5 to 24.9 is normal.  A BMI of 25 to 29.9 is considered overweight.  A BMI of 30 and above is considered obese.  Maintain normal blood lipids and cholesterol by exercising and minimizing your intake of saturated fat. Eat a balanced diet with plenty of fruits and vegetables. Blood tests for lipids and cholesterol should begin at age  25 and be repeated every 5 years. If your lipid or cholesterol levels are high, you are over 50, or you are a high risk for heart disease, you may need your cholesterol levels checked more frequently.Ongoing high lipid and cholesterol levels should be treated with medicines if diet and exercise are not effective.  If you smoke, find out from your caregiver how to quit. If you do not use tobacco, do not start.  Lung cancer screening is recommended for adults aged 2 80 years who are at high risk for developing lung cancer because of a history of smoking. Yearly low-dose computed tomography (CT) is recommended for people who have at least a 30-pack-year history of smoking and are a current smoker or have quit within the past 15 years. A pack year of smoking is smoking an average of 1 pack of cigarettes a day for 1 year (for example: 1 pack a day for 30 years or 2 packs a day for 15 years). Yearly screening should continue until the smoker has stopped smoking for at least 15 years. Yearly screening should also be stopped for people who develop a health problem that would prevent them from having lung cancer treatment.  If you are pregnant, do not drink alcohol. If you are breastfeeding, be very cautious about drinking alcohol. If you are not pregnant and choose to drink alcohol, do not exceed 1 drink per day. One drink is considered to be 12 ounces (355 mL) of beer, 5 ounces (148 mL) of wine, or 1.5 ounces (44  mL) of liquor.  Avoid use of street drugs. Do not share needles with anyone. Ask for help if you need support or instructions about stopping the use of drugs.  High blood pressure causes heart disease and increases the risk of stroke. Blood pressure should be checked at least every 1 to 2 years. Ongoing high blood pressure should be treated with medicines, if weight loss and exercise are not effective.  If you are 83 to 45 years old, ask your caregiver if you should take aspirin to prevent  strokes.  Diabetes screening involves taking a blood sample to check your fasting blood sugar level. This should be done once every 3 years, after age 28, if you are within normal weight and without risk factors for diabetes. Testing should be considered at a younger age or be carried out more frequently if you are overweight and have at least 1 risk factor for diabetes.  Breast cancer screening is essential preventative care for women. You should practice "breast self-awareness." This means understanding the normal appearance and feel of your breasts and may include breast self-examination. Any changes detected, no matter how small, should be reported to a caregiver. Women in their 19s and 30s should have a clinical breast exam (CBE) by a caregiver as part of a regular health exam every 1 to 3 years. After age 38, women should have a CBE every year. Starting at age 4, women should consider having a mammogram (breast X-ray) every year. Women who have a family history of breast cancer should talk to their caregiver about genetic screening. Women at a high risk of breast cancer should talk to their caregiver about having an MRI and a mammogram every year.  Breast cancer gene (BRCA)-related cancer risk assessment is recommended for women who have family members with BRCA-related cancers. BRCA-related cancers include breast, ovarian, tubal, and peritoneal cancers. Having family members with these cancers may be associated with an increased risk for harmful changes (mutations) in the breast cancer genes BRCA1 and BRCA2. Results of the assessment will determine the need for genetic counseling and BRCA1 and BRCA2 testing.  The Pap test is a screening test for cervical cancer. Women should have a Pap test starting at age 53. Between ages 49 and 39, Pap tests should be repeated every 2 years. Beginning at age 40, you should have a Pap test every 3 years as long as the past 3 Pap tests have been normal. If you had a  hysterectomy for a problem that was not cancer or a condition that could lead to cancer, then you no longer need Pap tests. If you are between ages 2 and 60, and you have had normal Pap tests going back 10 years, you no longer need Pap tests. If you have had past treatment for cervical cancer or a condition that could lead to cancer, you need Pap tests and screening for cancer for at least 20 years after your treatment. If Pap tests have been discontinued, risk factors (such as a new sexual partner) need to be reassessed to determine if screening should be resumed. Some women have medical problems that increase the chance of getting cervical cancer. In these cases, your caregiver may recommend more frequent screening and Pap tests.  The human papillomavirus (HPV) test is an additional test that may be used for cervical cancer screening. The HPV test looks for the virus that can cause the cell changes on the cervix. The cells collected during the Pap test can be  tested for HPV. The HPV test could be used to screen women aged 33 years and older, and should be used in women of any age who have unclear Pap test results. After the age of 35, women should have HPV testing at the same frequency as a Pap test.  Colorectal cancer can be detected and often prevented. Most routine colorectal cancer screening begins at the age of 107 and continues through age 56. However, your caregiver may recommend screening at an earlier age if you have risk factors for colon cancer. On a yearly basis, your caregiver may provide home test kits to check for hidden blood in the stool. Use of a small camera at the end of a tube, to directly examine the colon (sigmoidoscopy or colonoscopy), can detect the earliest forms of colorectal cancer. Talk to your caregiver about this at age 24, when routine screening begins. Direct examination of the colon should be repeated every 5 to 10 years through age 11, unless early forms of pre-cancerous  polyps or small growths are found.  Hepatitis C blood testing is recommended for all people born from [birth year] through 1966 and any individual with known risks for hepatitis C.  Practice safe sex. Use condoms and avoid high-risk sexual practices to reduce the spread of sexually transmitted infections (STIs). Sexually active women aged 46 and younger should be checked for Chlamydia, which is a common sexually transmitted infection. Older women with new or multiple partners should also be tested for Chlamydia. Testing for other STIs is recommended if you are sexually active and at increased risk.  Osteoporosis is a disease in which the bones lose minerals and strength with aging. This can result in serious bone fractures. The risk of osteoporosis can be identified using a bone density scan. Women ages 58 and over and women at risk for fractures or osteoporosis should discuss screening with their caregivers. Ask your caregiver whether you should be taking a calcium supplement or vitamin D to reduce the rate of osteoporosis.  Menopause can be associated with physical symptoms and risks. Hormone replacement therapy is available to decrease symptoms and risks. You should talk to your caregiver about whether hormone replacement therapy is right for you.  Use sunscreen. Apply sunscreen liberally and repeatedly throughout the day. You should seek shade when your shadow is shorter than you. Protect yourself by wearing long sleeves, pants, a wide-brimmed hat, and sunglasses year round, whenever you are outdoors.  Notify your caregiver of new moles or changes in moles, especially if there is a change in shape or color. Also notify your caregiver if a mole is larger than the size of a pencil eraser.  Stay current with your immunizations. Document Released: 12/10/2010 Document Revised: 09/21/2012 Document Reviewed: 12/10/2010 [facility] Patient Information 2014 [place].

## 2014-11-22 NOTE — Progress Notes (Signed)
Stacy Snyder [birth year] [id number]        44 y.o.  G2P2 for annual exam.  Several issues noted below.  Past medical history,surgical history, problem list, medications, allergies, family history and social history were all reviewed and documented as reviewed in the EPIC chart.  ROS:  Performed with pertinent positives and negatives included in the history, assessment and plan.   Additional significant findings :  none   Exam: Kim Counsellor Vitals:   11/22/14 1519  BP: 120/74  Height: 5\' 3"  (1.6 m)  Weight: 279 lb (126.554 kg)   General appearance:  Normal affect, orientation and appearance. Skin: Grossly normal HEENT: Without gross lesions.  No cervical or supraclavicular adenopathy. Thyroid normal.  Lungs:  Clear without wheezing, rales or rhonchi Cardiac: RR, without RMG Abdominal:  Soft, nontender, without masses, guarding, rebound, organomegaly or hernia Breasts:  Examined lying and sitting without masses, retractions, discharge or axillary adenopathy. Pelvic:  Ext/BUS/vagina normal  Cervix normal. Pap/HPV  Uterus difficult to palpate but no gross masses or tenderness.  Adnexa  Without gross masses or tenderness    Anus and perineum  Normal   Rectovaginal  Normal sphincter tone without palpated masses or tenderness.    Assessment/Plan:  44 y.o. G2P2 female for annual exam without menses, tubal sterilization.   1. Premature menopause. Patient remains without menses. Had been on HRT in the past. Ran out of medication and stopped. She is having some hot flushes and sweats. Reviewed issues of HRT to include risks/benefits particularly in a younger woman. Potential benefit as far as cardiovascular bone health versus risks such as stroke heart attack DVT possible breast cancer. After a lengthy discussion the patient wants to go ahead and reinitiate. Was using Estrace 0.5 mg and Prometrium 100 mg nightly previously and doing well with this. I represcribed these 1 year. Follow  up if any issues. Patient knows to call if she does any bleeding. 2. Mammography 08/2014. Continue with annual mammography. SBE monthly reviewed. 3. Pap smear 2012. Pap smear/HPV done today.  No history of abnormal Pap smears previously. 4. History of HSV. Has not had outbreaks in years. Continue to monitor and report any outbreaks. 5. Had been on fluoxetine previously. Ran out of this medication also and is doing well off of it without significant anxiety/depression. Will stay off of this and follow up if any issues develop. 6. Health maintenance. Patient requests baseline labs.  CBC, comprehensive metabolic panel, lipid profile, TSH, urinalysis, vitamin D ordered.  Does have a history of vitamin D deficiency in the past. Follow up in one year, sooner as needed.     [name] MD, 3:48 PM 11/22/2014

## 2014-11-22 NOTE — Addendum Note (Signed)
Addended by: Nelva Nay on: 11/22/2014 04:34 PM   Modules accepted: Orders

## 2014-11-23 ENCOUNTER — Other Ambulatory Visit: Payer: Self-pay | Admitting: Gynecology

## 2014-11-23 DIAGNOSIS — E7800 Pure hypercholesterolemia, unspecified: Secondary | ICD-10-CM

## 2014-11-23 DIAGNOSIS — E559 Vitamin D deficiency, unspecified: Secondary | ICD-10-CM

## 2014-11-23 LAB — URINALYSIS W MICROSCOPIC + REFLEX CULTURE
BACTERIA UA: NONE SEEN
BILIRUBIN URINE: NEGATIVE
CASTS: NONE SEEN
Crystals: NONE SEEN
GLUCOSE, UA: NEGATIVE mg/dL
HGB URINE DIPSTICK: NEGATIVE
KETONES UR: NEGATIVE mg/dL
LEUKOCYTES UA: NEGATIVE
NITRITE: NEGATIVE
Protein, ur: NEGATIVE mg/dL
Specific Gravity, Urine: 1.011 (ref 1.005–1.030)
Squamous Epithelial / LPF: NONE SEEN
UROBILINOGEN UA: 0.2 mg/dL (ref 0.0–1.0)
pH: 5 (ref 5.0–8.0)

## 2014-11-23 LAB — VITAMIN D 25 HYDROXY (VIT D DEFICIENCY, FRACTURES): VIT D 25 HYDROXY: 25 ng/mL — AB (ref 30–100)

## 2014-11-24 LAB — CYTOLOGY - PAP

## 2015-05-08 ENCOUNTER — Encounter: Payer: Self-pay | Admitting: Internal Medicine

## 2015-05-19 ENCOUNTER — Other Ambulatory Visit: Payer: BC Managed Care – PPO

## 2015-05-19 DIAGNOSIS — E7800 Pure hypercholesterolemia, unspecified: Secondary | ICD-10-CM

## 2015-05-19 DIAGNOSIS — E559 Vitamin D deficiency, unspecified: Secondary | ICD-10-CM

## 2015-05-19 LAB — LIPID PANEL
CHOL/HDL RATIO: 3.4 ratio (ref <=5.0)
Cholesterol: 213 mg/dL — ABNORMAL HIGH (ref 125–200)
HDL: 63 mg/dL (ref >=46)
LDL Cholesterol: 127 mg/dL (ref <130)
Triglycerides: 116 mg/dL (ref <150)
VLDL: 23 mg/dL (ref <30)

## 2015-05-20 LAB — VITAMIN D 25 HYDROXY (VIT D DEFICIENCY, FRACTURES): Vit D, 25-Hydroxy: 28 ng/mL — ABNORMAL LOW (ref 30–100)

## 2015-05-25 ENCOUNTER — Other Ambulatory Visit: Payer: Self-pay

## 2015-05-25 ENCOUNTER — Other Ambulatory Visit: Payer: Self-pay | Admitting: Gynecology

## 2015-05-25 DIAGNOSIS — E559 Vitamin D deficiency, unspecified: Secondary | ICD-10-CM

## 2015-05-25 MED ORDER — VITAMIN D (ERGOCALCIFEROL) 1.25 MG (50000 UNIT) PO CAPS: 50000.0000 [IU] | ORAL_CAPSULE | ORAL | Status: DC

## 2015-08-11 ENCOUNTER — Other Ambulatory Visit: Payer: Self-pay

## 2015-08-11 DIAGNOSIS — Z1231 Encounter for screening mammogram for malignant neoplasm of breast: Secondary | ICD-10-CM

## 2015-08-21 ENCOUNTER — Ambulatory Visit: Payer: BC Managed Care – PPO

## 2015-08-22 ENCOUNTER — Other Ambulatory Visit: Payer: Self-pay

## 2015-08-22 ENCOUNTER — Other Ambulatory Visit: Payer: BC Managed Care – PPO

## 2015-08-22 ENCOUNTER — Ambulatory Visit
Admission: RE | Admit: 2015-08-22 | Discharge: 2015-08-22 | Disposition: A | Discharge status: Home / Self Care | Payer: BC Managed Care – PPO | Source: Ambulatory Visit

## 2015-08-22 DIAGNOSIS — Z1231 Encounter for screening mammogram for malignant neoplasm of breast: Secondary | ICD-10-CM

## 2015-08-22 DIAGNOSIS — E559 Vitamin D deficiency, unspecified: Secondary | ICD-10-CM

## 2015-08-23 ENCOUNTER — Telehealth: Payer: Self-pay

## 2015-08-23 LAB — VITAMIN D 25 HYDROXY (VIT D DEFICIENCY, FRACTURES): VIT D 25 HYDROXY: 49 ng/mL (ref 30–100)

## 2015-08-23 NOTE — Telephone Encounter (Signed)
Patient advised of below. She said she has been taking the Vit D 2000 daily for the last year. She wonders if it's going to be enough?

## 2015-08-23 NOTE — Telephone Encounter (Signed)
Patient informed. 

## 2015-08-23 NOTE — Telephone Encounter (Signed)
Hopefully as we have gotten her baseline level up into the normal range she can maintain it with the 2000.  She is due for her annual exam in 3 months and we can check it then.

## 2015-08-23 NOTE — Telephone Encounter (Signed)
-----   Message from [name], MD sent at 08/23/2015  8:51 AM EDT ----- The patient her vitamin D levels better at 49. Recommend staying on 2000 units of vitamin D OTC daily and we will recheck it when she has her next annual exam

## 2015-12-15 ENCOUNTER — Other Ambulatory Visit: Payer: Self-pay | Admitting: Gynecology

## 2015-12-15 NOTE — Telephone Encounter (Signed)
CE is scheduled for 01/02/16.

## 2016-01-02 ENCOUNTER — Encounter: Payer: Self-pay | Admitting: Gynecology

## 2016-01-02 ENCOUNTER — Ambulatory Visit (INDEPENDENT_AMBULATORY_CARE_PROVIDER_SITE_OTHER): Payer: BC Managed Care – PPO | Admitting: Gynecology

## 2016-01-02 VITALS — BP 122/78 | Ht 63.0 in | Wt 276.0 lb

## 2016-01-02 DIAGNOSIS — Z1329 Encounter for screening for other suspected endocrine disorder: Secondary | ICD-10-CM | POA: Diagnosis not present

## 2016-01-02 DIAGNOSIS — E559 Vitamin D deficiency, unspecified: Secondary | ICD-10-CM | POA: Diagnosis not present

## 2016-01-02 DIAGNOSIS — Z1321 Encounter for screening for nutritional disorder: Secondary | ICD-10-CM | POA: Diagnosis not present

## 2016-01-02 DIAGNOSIS — Z01419 Encounter for gynecological examination (general) (routine) without abnormal findings: Secondary | ICD-10-CM | POA: Diagnosis not present

## 2016-01-02 DIAGNOSIS — Z9229 Personal history of other drug therapy: Secondary | ICD-10-CM | POA: Diagnosis not present

## 2016-01-02 DIAGNOSIS — Z13220 Encounter for screening for lipoid disorders: Secondary | ICD-10-CM

## 2016-01-02 LAB — TSH: TSH: 2.51 mIU/L

## 2016-01-02 MED ORDER — PROGESTERONE MICRONIZED 100 MG PO CAPS: 100.0000 mg | ORAL_CAPSULE | Freq: Every day | ORAL | 12 refills | Status: DC

## 2016-01-02 MED ORDER — ESTRADIOL 0.5 MG PO TABS: 0.5000 mg | ORAL_TABLET | Freq: Every day | ORAL | 12 refills | Status: DC

## 2016-01-02 NOTE — Progress Notes (Signed)
    Stacy Snyder 1971-01-26 [id number]        45 y.o.  G2P2  for annual exam.  Doing well without complaints.   Past medical history,surgical history, problem list, medications, allergies, family history and social history were all reviewed and documented as reviewed in the EPIC chart.  ROS:  Performed with pertinent positives and negatives included in the history, assessment and plan.   Additional significant findings :  None   Exam: Caryn Bee assistant Vitals:   01/02/16 1535  BP: 122/78  Weight: 276 lb (125.2 kg)  Height: 5\' 3"  (1.6 m)   General appearance:  Normal affect, orientation and appearance. Skin: Grossly normal HEENT: Without gross lesions.  No cervical or supraclavicular adenopathy. Thyroid normal.  Lungs:  Clear without wheezing, rales or rhonchi Cardiac: RR, without RMG Abdominal:  Soft, nontender, without masses, guarding, rebound, organomegaly or hernia Breasts:  Examined lying and sitting without masses, retractions, discharge or axillary adenopathy. Pelvic:  Ext/BUS/Vagina Normal  Cervix normal  Uterus difficult to palpate but grossly normal in size. No masses or tenderness.  Adnexa without gross masses or tenderness    Anus and perineum normal   Rectovaginal normal sphincter tone without palpated masses or tenderness.    Assessment/Plan:  45 y.o. G2P2 female for annual exam.   1. 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 Continues on estradiol 0.5 mg daily and Prometrium 100 mg nightly. I reviewed the most recent 2017 [facility] HRT guidelines. Benefits of use in a patient with premature menopause as far as cardiovascular and bone health as well as symptom relief with possible risks to include increased risk of thrombosis such as stroke heart attack DVT and possible breast cancer. Patient's comfortable continuing and I refilled her 1 year. Has done no bleeding and patient knows to report any bleeding. 2. Pap smear/HPV 2016. No Pap smear done today. No history of  significant abnormal Pap smears. Plan repeat Pap smear at 5 year interval per current screening guidelines. 3. Mammography 08/2015. Continue with annual mammography when due. SBE monthly reviewed. 4. 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 We'll monitor report any outbreaks. 5. Health maintenance. History of vitamin D deficiency in the past. Check baseline CBC, CMP, lipid profile, urinalysis, TSH and vitamin D. Follow up 1 year, sooner if any issues.   [name] MD, 4:01 PM 01/02/2016

## 2016-01-02 NOTE — Patient Instructions (Signed)

## 2016-01-03 ENCOUNTER — Encounter: Payer: Self-pay | Admitting: Gynecology

## 2016-01-03 LAB — LIPID PANEL
Cholesterol: 202 mg/dL — ABNORMAL HIGH (ref 125–200)
HDL: 60 mg/dL (ref >=46)
LDL CALC: 114 mg/dL (ref <130)
TRIGLYCERIDES: 142 mg/dL (ref <150)
Total CHOL/HDL Ratio: 3.4 Ratio (ref <=5.0)
VLDL: 28 mg/dL (ref <30)

## 2016-01-03 LAB — URINALYSIS W MICROSCOPIC + REFLEX CULTURE
BACTERIA UA: NONE SEEN [HPF]
Bilirubin Urine: NEGATIVE
CRYSTALS: NONE SEEN [HPF]
Casts: NONE SEEN [LPF]
Glucose, UA: NEGATIVE
HGB URINE DIPSTICK: NEGATIVE
Ketones, ur: NEGATIVE
Leukocytes, UA: NEGATIVE
Nitrite: NEGATIVE
PROTEIN: NEGATIVE
RBC / HPF: NONE SEEN RBC/HPF (ref <=2)
Specific Gravity, Urine: 1.007 (ref 1.001–1.035)
WBC, UA: NONE SEEN WBC/HPF (ref <=5)
YEAST: NONE SEEN [HPF]
pH: 6.5 (ref 5.0–8.0)

## 2016-01-03 LAB — VITAMIN D 25 HYDROXY (VIT D DEFICIENCY, FRACTURES): Vit D, 25-Hydroxy: 38 ng/mL (ref 30–100)

## 2016-01-03 LAB — COMPREHENSIVE METABOLIC PANEL
ALBUMIN: 4.1 g/dL (ref 3.6–5.1)
ALT: 19 U/L (ref 6–29)
AST: 29 U/L (ref 10–35)
Alkaline Phosphatase: 113 U/L (ref 33–115)
BILIRUBIN TOTAL: 0.4 mg/dL (ref 0.2–1.2)
BUN: 7 mg/dL (ref 7–25)
CHLORIDE: 101 mmol/L (ref 98–110)
CO2: 26 mmol/L (ref 20–31)
CREATININE: 0.8 mg/dL (ref 0.50–1.10)
Calcium: 9.2 mg/dL (ref 8.6–10.2)
Glucose, Bld: 88 mg/dL (ref 65–99)
Potassium: 4 mmol/L (ref 3.5–5.3)
SODIUM: 139 mmol/L (ref 135–146)
Total Protein: 6.9 g/dL (ref 6.1–8.1)

## 2016-01-03 LAB — CBC WITH DIFFERENTIAL/PLATELET
BASOS PCT: 0 %
Basophils Absolute: 0 cells/uL (ref 0–200)
EOS PCT: 1 %
Eosinophils Absolute: 75 cells/uL (ref 15–500)
HEMATOCRIT: 37.5 % (ref 35.0–45.0)
HEMOGLOBIN: 12.3 g/dL (ref 11.7–15.5)
LYMPHS ABS: 3075 {cells}/uL (ref 850–3900)
Lymphocytes Relative: 41 %
MCH: 27.8 pg (ref 27.0–33.0)
MCHC: 32.8 g/dL (ref 32.0–36.0)
MCV: 84.8 fL (ref 80.0–100.0)
MONO ABS: 600 {cells}/uL (ref 200–950)
MPV: 9.6 fL (ref 7.5–12.5)
Monocytes Relative: 8 %
NEUTROS ABS: 3750 {cells}/uL (ref 1500–7800)
Neutrophils Relative %: 50 %
Platelets: 414 10*3/uL — ABNORMAL HIGH (ref 140–400)
RBC: 4.42 MIL/uL (ref 3.80–5.10)
RDW: 14.1 % (ref 11.0–15.0)
WBC: 7.5 10*3/uL (ref 3.8–10.8)

## 2016-01-17 ENCOUNTER — Telehealth: Payer: Self-pay | Admitting: *Deleted

## 2016-01-17 NOTE — Telephone Encounter (Signed)
Left message for pt call 

## 2016-01-17 NOTE — Telephone Encounter (Signed)
-----   Message from [name], MD sent at 01/17/2016  7:51 AM EDT ----- Patient never picked up on email that I sent her. Please relates the following.  Stacy Snyder,   Your cholesterol and LDH were minimally elevated, better than before. You can obtain copies of these from My Chart. Your vitamin D was at the lower limits of normal. I would add an additional 1000 units over-the-counter vitamin D supplement daily. The rest of your labs were normal.   [name]

## 2016-01-17 NOTE — Telephone Encounter (Signed)
Pt informed with the below note. 

## 2016-10-02 ENCOUNTER — Other Ambulatory Visit: Payer: Self-pay | Admitting: Gynecology

## 2016-10-02 DIAGNOSIS — Z1231 Encounter for screening mammogram for malignant neoplasm of breast: Secondary | ICD-10-CM

## 2016-10-15 ENCOUNTER — Ambulatory Visit
Admission: RE | Admit: 2016-10-15 | Discharge: 2016-10-15 | Disposition: A | Discharge status: Home / Self Care | Payer: BC Managed Care – PPO | Source: Ambulatory Visit | Attending: Gynecology | Admitting: Gynecology

## 2016-10-15 DIAGNOSIS — Z1231 Encounter for screening mammogram for malignant neoplasm of breast: Secondary | ICD-10-CM

## 2017-01-02 ENCOUNTER — Encounter: Payer: BC Managed Care – PPO | Admitting: Gynecology

## 2017-01-16 ENCOUNTER — Ambulatory Visit (INDEPENDENT_AMBULATORY_CARE_PROVIDER_SITE_OTHER): Payer: BC Managed Care – PPO | Admitting: Gynecology

## 2017-01-16 ENCOUNTER — Encounter: Payer: Self-pay | Admitting: Gynecology

## 2017-01-16 VITALS — BP 122/74 | Ht 63.0 in | Wt 254.0 lb

## 2017-01-16 DIAGNOSIS — E559 Vitamin D deficiency, unspecified: Secondary | ICD-10-CM

## 2017-01-16 DIAGNOSIS — Z13220 Encounter for screening for lipoid disorders: Secondary | ICD-10-CM

## 2017-01-16 DIAGNOSIS — Z01411 Encounter for gynecological examination (general) (routine) with abnormal findings: Secondary | ICD-10-CM | POA: Diagnosis not present

## 2017-01-16 DIAGNOSIS — Z79890 Hormone replacement therapy (postmenopausal): Secondary | ICD-10-CM

## 2017-01-16 LAB — CBC WITH DIFFERENTIAL/PLATELET
Basophils Absolute: 0 cells/uL (ref 0–200)
Basophils Relative: 0 %
EOS PCT: 1 %
Eosinophils Absolute: 106 cells/uL (ref 15–500)
HCT: 39.9 % (ref 35.0–45.0)
Hemoglobin: 12.7 g/dL (ref 11.7–15.5)
LYMPHS ABS: 3286 {cells}/uL (ref 850–3900)
Lymphocytes Relative: 31 %
MCH: 27.4 pg (ref 27.0–33.0)
MCHC: 31.8 g/dL — ABNORMAL LOW (ref 32.0–36.0)
MCV: 86 fL (ref 80.0–100.0)
MPV: 10.1 fL (ref 7.5–12.5)
Monocytes Absolute: 636 cells/uL (ref 200–950)
Monocytes Relative: 6 %
NEUTROS PCT: 62 %
Neutro Abs: 6572 cells/uL (ref 1500–7800)
Platelets: 438 10*3/uL — ABNORMAL HIGH (ref 140–400)
RBC: 4.64 MIL/uL (ref 3.80–5.10)
RDW: 14.8 % (ref 11.0–15.0)
WBC: 10.6 10*3/uL (ref 3.8–10.8)

## 2017-01-16 MED ORDER — ESTRADIOL 0.5 MG PO TABS: 0.5000 mg | ORAL_TABLET | Freq: Every day | ORAL | 12 refills | Status: DC

## 2017-01-16 MED ORDER — PROGESTERONE MICRONIZED 100 MG PO CAPS: 100.0000 mg | ORAL_CAPSULE | Freq: Every day | ORAL | 12 refills | Status: DC

## 2017-01-16 NOTE — Patient Instructions (Signed)
Follow up in one year for annual exam 

## 2017-01-16 NOTE — Progress Notes (Signed)
    Stacy Snyder Jan 10, 1971 [id number]        46 y.o.  G2P2 for annual exam.    Past medical history,surgical history, problem list, medications, allergies, family history and social history were all reviewed and documented as reviewed in the EPIC chart.  ROS:  Performed with pertinent positives and negatives included in the history, assessment and plan.   Additional significant findings :  None   Exam: Caryn Bee assistant Vitals:   01/16/17 1606  BP: 122/74  Weight: 254 lb (115.2 kg)  Height: 5\' 3"  (1.6 m)   Body mass index is 44.99 kg/m.  General appearance:  Normal affect, orientation and appearance. Skin: Grossly normal HEENT: Without gross lesions.  No cervical or supraclavicular adenopathy. Thyroid normal.  Lungs:  Clear without wheezing, rales or rhonchi Cardiac: RR, without RMG Abdominal:  Soft, nontender, without masses, guarding, rebound, organomegaly or hernia Breasts:  Examined lying and sitting without masses, retractions, discharge or axillary adenopathy. Pelvic:  Ext, BUS, Vagina: Normal with mild atrophic changes  Cervix: Normal  Uterus: Difficult to palpate but no gross masses or tenderness   Adnexa: Without gross masses or tenderness    Anus and perineum: Normal   Rectovaginal: Normal sphincter tone without palpated masses or tenderness.    Assessment/Plan:  46 y.o. G2P2 female for annual exam.   1. Postmenopausal/HRT. Underwent premature menopause. On estradiol 0.5 mg and Prometrium 100 mg. Doing well with no bleeding. Risks versus benefits have been discussed to include symptom relief, cardiovascular impossible bone health versus risks to include stroke heart attack DVT and breast cancer. Refill 1 year provided. Call if any issues or bleeding. 2. Pap smear/HPV 2016. No Pap smear done today. No history of significant abnormal Pap smears. Plan repeat Pap smear at 5 year interval per current screening guidelines. 3. Mammography 10/2016. Continue with  annual mammography when due. Breast exam normal today. Self breast awareness. 4. History of HSV without outbreaks times years. We'll called becomes an issue. 5. Health maintenance. Baseline CBC, CMP, lipid profile, vitamin D due to history of vitamin D deficiency ordered. Follow up in one year, sooner as needed.   [name] MD, 4:32 PM 01/16/2017

## 2017-01-17 ENCOUNTER — Telehealth: Payer: Self-pay | Admitting: Gynecology

## 2017-01-17 DIAGNOSIS — R7989 Other specified abnormal findings of blood chemistry: Secondary | ICD-10-CM

## 2017-01-17 DIAGNOSIS — R7309 Other abnormal glucose: Secondary | ICD-10-CM

## 2017-01-17 LAB — VITAMIN D 25 HYDROXY (VIT D DEFICIENCY, FRACTURES): VIT D 25 HYDROXY: 35 ng/mL (ref 30–100)

## 2017-01-17 LAB — URINALYSIS W MICROSCOPIC + REFLEX CULTURE
BILIRUBIN URINE: NEGATIVE
Bacteria, UA: NONE SEEN [HPF]
Casts: NONE SEEN [LPF]
Crystals: NONE SEEN [HPF]
Glucose, UA: NEGATIVE
Hgb urine dipstick: NEGATIVE
KETONES UR: NEGATIVE
Leukocytes, UA: NEGATIVE
Nitrite: NEGATIVE
Protein, ur: NEGATIVE
RBC / HPF: NONE SEEN RBC/HPF (ref <=2)
SQUAMOUS EPITHELIAL / LPF: NONE SEEN [HPF] (ref <=5)
Specific Gravity, Urine: 1.015 (ref 1.001–1.035)
WBC, UA: NONE SEEN WBC/HPF (ref <=5)
Yeast: NONE SEEN [HPF]
pH: 5.5 (ref 5.0–8.0)

## 2017-01-17 LAB — COMPREHENSIVE METABOLIC PANEL
ALK PHOS: 107 U/L (ref 33–115)
ALT: 13 U/L (ref 6–29)
AST: 19 U/L (ref 10–35)
Albumin: 4.2 g/dL (ref 3.6–5.1)
BILIRUBIN TOTAL: 0.4 mg/dL (ref 0.2–1.2)
BUN: 14 mg/dL (ref 7–25)
CO2: 22 mmol/L (ref 20–32)
CREATININE: 0.93 mg/dL (ref 0.50–1.10)
Calcium: 9.8 mg/dL (ref 8.6–10.2)
Chloride: 104 mmol/L (ref 98–110)
Glucose, Bld: 131 mg/dL — ABNORMAL HIGH (ref 65–99)
Potassium: 4.2 mmol/L (ref 3.5–5.3)
SODIUM: 138 mmol/L (ref 135–146)
Total Protein: 7 g/dL (ref 6.1–8.1)

## 2017-01-17 LAB — LIPID PANEL
CHOLESTEROL: 204 mg/dL — AB (ref <200)
HDL: 66 mg/dL (ref >50)
LDL Cholesterol: 112 mg/dL — ABNORMAL HIGH (ref <100)
Total CHOL/HDL Ratio: 3.1 Ratio (ref <5.0)
Triglycerides: 128 mg/dL (ref <150)
VLDL: 26 mg/dL (ref <30)

## 2017-01-17 NOTE — Telephone Encounter (Signed)
Tell patient:  #1 glucose 131 which is borderline elevated. Recommend fasting glucose with hemoglobin A1c #2 cholesterol and LDL were both borderline elevated. HDL  is in a good range. I know that she is exercising and losing weight which will help with this. #3 her vitamin D levels at the lower end of normal. Check to see how much vitamin D she is taking daily and I will suggest an appropriate amount.

## 2017-01-17 NOTE — Telephone Encounter (Signed)
Pt informed states she takes 2,000 units daily

## 2017-01-17 NOTE — Telephone Encounter (Signed)
Pt informed, order placed, recall letter placed

## 2017-01-17 NOTE — Telephone Encounter (Signed)
I would go to 4000 units daily and recheck her vitamin D level in 6 months

## 2017-01-27 ENCOUNTER — Other Ambulatory Visit: Payer: BC Managed Care – PPO

## 2017-01-27 DIAGNOSIS — R7309 Other abnormal glucose: Secondary | ICD-10-CM

## 2017-01-27 DIAGNOSIS — R7989 Other specified abnormal findings of blood chemistry: Secondary | ICD-10-CM

## 2017-01-28 LAB — VITAMIN D 25 HYDROXY (VIT D DEFICIENCY, FRACTURES): Vit D, 25-Hydroxy: 34 ng/mL (ref 30–100)

## 2017-01-28 LAB — GLUCOSE, RANDOM: Glucose, Bld: 89 mg/dL (ref 65–99)

## 2017-01-28 LAB — HEMOGLOBIN A1C
HEMOGLOBIN A1C: 5.4 % (ref <5.7)
Mean Plasma Glucose: 108 mg/dL

## 2017-07-09 ENCOUNTER — Other Ambulatory Visit: Payer: Self-pay | Admitting: *Deleted

## 2017-07-09 DIAGNOSIS — E559 Vitamin D deficiency, unspecified: Secondary | ICD-10-CM

## 2017-10-15 ENCOUNTER — Other Ambulatory Visit: Payer: BC Managed Care – PPO

## 2017-10-15 DIAGNOSIS — E559 Vitamin D deficiency, unspecified: Secondary | ICD-10-CM

## 2017-10-16 ENCOUNTER — Encounter: Payer: Self-pay | Admitting: Gynecology

## 2017-10-16 LAB — VITAMIN D 25 HYDROXY (VIT D DEFICIENCY, FRACTURES): VIT D 25 HYDROXY: 43 ng/mL (ref 30–100)

## 2017-10-20 ENCOUNTER — Other Ambulatory Visit: Payer: Self-pay | Admitting: Gynecology

## 2017-10-20 DIAGNOSIS — Z1231 Encounter for screening mammogram for malignant neoplasm of breast: Secondary | ICD-10-CM

## 2017-11-12 ENCOUNTER — Ambulatory Visit
Admission: RE | Admit: 2017-11-12 | Discharge: 2017-11-12 | Disposition: A | Discharge status: Home / Self Care | Payer: BC Managed Care – PPO | Source: Ambulatory Visit | Attending: Gynecology | Admitting: Gynecology

## 2017-11-12 DIAGNOSIS — Z1231 Encounter for screening mammogram for malignant neoplasm of breast: Secondary | ICD-10-CM

## 2017-12-03 ENCOUNTER — Telehealth: Payer: Self-pay | Admitting: *Deleted

## 2017-12-03 MED ORDER — VALACYCLOVIR HCL 500 MG PO TABS: ORAL_TABLET | ORAL | 1 refills | Status: DC

## 2017-12-03 NOTE — Telephone Encounter (Signed)
(  Dr. [name] patient) Patient called requesting Rx for Valtrex no outbreak in several years, called today requesting Rx, Dr.Fontaine told patient to call if Rx needed. Please advise

## 2017-12-03 NOTE — Telephone Encounter (Signed)
Left message for pt to call to find out which pharmacy Rx should be sent.

## 2017-12-03 NOTE — Telephone Encounter (Signed)
Okay for Valtrex 500 twice daily for 3 to 5 days #30 with 1 refill. OV if no relief

## 2017-12-03 NOTE — Telephone Encounter (Signed)
Pt called back Rx sent.

## 2018-02-13 ENCOUNTER — Other Ambulatory Visit: Payer: Self-pay | Admitting: Gynecology

## 2018-02-18 ENCOUNTER — Other Ambulatory Visit: Payer: Self-pay | Admitting: Women's Health

## 2018-02-19 NOTE — Telephone Encounter (Signed)
annual scheduled on 03/30/18

## 2018-03-30 ENCOUNTER — Encounter: Payer: Self-pay | Admitting: Gynecology

## 2018-03-30 ENCOUNTER — Other Ambulatory Visit: Payer: BC Managed Care – PPO

## 2018-03-30 ENCOUNTER — Ambulatory Visit: Payer: BC Managed Care – PPO | Admitting: Gynecology

## 2018-03-30 ENCOUNTER — Telehealth: Payer: Self-pay | Admitting: *Deleted

## 2018-03-30 VITALS — BP 124/76 | Ht 62.5 in | Wt 258.0 lb

## 2018-03-30 DIAGNOSIS — Z13220 Encounter for screening for lipoid disorders: Secondary | ICD-10-CM | POA: Diagnosis not present

## 2018-03-30 DIAGNOSIS — Z01419 Encounter for gynecological examination (general) (routine) without abnormal findings: Secondary | ICD-10-CM | POA: Diagnosis not present

## 2018-03-30 DIAGNOSIS — Z79890 Hormone replacement therapy (postmenopausal): Secondary | ICD-10-CM

## 2018-03-30 DIAGNOSIS — Z23 Encounter for immunization: Secondary | ICD-10-CM

## 2018-03-30 DIAGNOSIS — Z8639 Personal history of other endocrine, nutritional and metabolic disease: Secondary | ICD-10-CM

## 2018-03-30 DIAGNOSIS — N952 Postmenopausal atrophic vaginitis: Secondary | ICD-10-CM | POA: Diagnosis not present

## 2018-03-30 MED ORDER — ESTRADIOL 0.5 MG PO TABS: 0.5000 mg | ORAL_TABLET | Freq: Every day | ORAL | 12 refills | Status: DC

## 2018-03-30 MED ORDER — VALACYCLOVIR HCL 500 MG PO TABS: ORAL_TABLET | ORAL | 12 refills | Status: DC

## 2018-03-30 MED ORDER — PROGESTERONE MICRONIZED 100 MG PO CAPS
100.0000 mg | ORAL_CAPSULE | Freq: Every day | ORAL | 12 refills | Status: DC
Start: 2018-03-30 — End: 2019-04-07

## 2018-03-30 NOTE — Telephone Encounter (Signed)
Patient scheduled for annual exam today at 2pm, would like to come in this am for fasting labs.  Please advise

## 2018-03-30 NOTE — Patient Instructions (Signed)
Follow up for fasting blood work

## 2018-03-30 NOTE — Telephone Encounter (Signed)
CBC, CMP, lipid profile

## 2018-03-30 NOTE — Progress Notes (Signed)
    Stacy Snyder Jul 05, 1970 [id number]        47 y.o.  G2P2 for annual gynecologic exam.  Doing well without complaints.  Past medical history,surgical history, problem list, medications, allergies, family history and social history were all reviewed and documented as reviewed in the EPIC chart.  ROS:  Performed with pertinent positives and negatives included in the history, assessment and plan.   Additional significant findings : None   Exam: Caryn Bee assistant Vitals:   03/30/18 1354  BP: 124/76  Weight: 258 lb (117 kg)  Height: 5' 2.5" (1.588 m)   Body mass index is 46.44 kg/m.  General appearance:  Normal affect, orientation and appearance. Skin: Grossly normal HEENT: Without gross lesions.  No cervical or supraclavicular adenopathy. Thyroid normal.  Lungs:  Clear without wheezing, rales or rhonchi Cardiac: RR, without RMG Abdominal:  Soft, nontender, without masses, guarding, rebound, organomegaly or hernia Breasts:  Examined lying and sitting without masses, retractions, discharge or axillary adenopathy. Pelvic:  Ext, BUS, Vagina: Normal  Cervix: Normal  Uterus: Difficult to palpate but no gross masses or tenderness  Adnexa: Without masses or tenderness    Anus and perineum: Normal   Rectovaginal: Normal sphincter tone without palpated masses or tenderness.    Assessment/Plan:  47 y.o. G2P2 female for annual gynecologic exam.   1. Postmenopausal/HRT.  Continues on estradiol 0.5 mg and Prometrium 100 mg nightly.  Doing well with no bleeding and good symptom relief.  We again discussed the risks versus benefits to include risks of thrombosis such as stroke heart attack DVT and breast cancer issue.  Benefits as far as cardiovascular and bone health as well as symptom relief also discussed.  At this point the patient wants to continue and I refilled her x1 year. 2. Mammography 11/2017.  Continue with annual mammography when due.  Breast exam normal today. 3. Pap  smear/HPV 2016.  No Pap smear done today.  No history of abnormal Pap smears.  Plan repeat Pap smear/HPV at 5-year interval per current screening guidelines. 4. History of HSV.  Is having more frequent outbreaks and started Valtrex 500 mg daily with good suppressive results.  Refill x1 year provided. 5. Health maintenance.  Patient is going to return for fasting CBC, CMP, lipid profile and vitamin D due to her history of deficiency in the past.  We also discussed weight loss strategies.  Follow-up 1 year, sooner as needed.   [name] MD, 2:37 PM 03/30/2018

## 2018-03-30 NOTE — Telephone Encounter (Signed)
I called and left on voicemail orders placed.

## 2018-04-22 ENCOUNTER — Other Ambulatory Visit: Payer: Self-pay | Admitting: Gynecology

## 2018-05-20 ENCOUNTER — Other Ambulatory Visit: Payer: BC Managed Care – PPO

## 2018-05-20 DIAGNOSIS — Z8639 Personal history of other endocrine, nutritional and metabolic disease: Secondary | ICD-10-CM

## 2018-05-20 DIAGNOSIS — Z13220 Encounter for screening for lipoid disorders: Secondary | ICD-10-CM

## 2018-05-20 DIAGNOSIS — Z01419 Encounter for gynecological examination (general) (routine) without abnormal findings: Secondary | ICD-10-CM

## 2018-05-21 ENCOUNTER — Encounter: Payer: Self-pay | Admitting: Gynecology

## 2018-05-21 LAB — COMPREHENSIVE METABOLIC PANEL
AG Ratio: 1.6 (calc) (ref 1.0–2.5)
ALBUMIN MSPROF: 3.9 g/dL (ref 3.6–5.1)
ALKALINE PHOSPHATASE (APISO): 87 U/L (ref 33–115)
ALT: 11 U/L (ref 6–29)
AST: 17 U/L (ref 10–35)
BUN: 13 mg/dL (ref 7–25)
CHLORIDE: 104 mmol/L (ref 98–110)
CO2: 27 mmol/L (ref 20–32)
CREATININE: 0.78 mg/dL (ref 0.50–1.10)
Calcium: 9.3 mg/dL (ref 8.6–10.2)
Globulin: 2.4 g/dL (calc) (ref 1.9–3.7)
Glucose, Bld: 84 mg/dL (ref 65–99)
Potassium: 4.4 mmol/L (ref 3.5–5.3)
SODIUM: 139 mmol/L (ref 135–146)
Total Bilirubin: 0.4 mg/dL (ref 0.2–1.2)
Total Protein: 6.3 g/dL (ref 6.1–8.1)

## 2018-05-21 LAB — VITAMIN D 25 HYDROXY (VIT D DEFICIENCY, FRACTURES): Vit D, 25-Hydroxy: 49 ng/mL (ref 30–100)

## 2018-05-21 LAB — CBC WITH DIFFERENTIAL/PLATELET
BASOS ABS: 28 {cells}/uL (ref 0–200)
BASOS PCT: 0.4 %
EOS ABS: 91 {cells}/uL (ref 15–500)
EOS PCT: 1.3 %
HCT: 36.6 % (ref 35.0–45.0)
HEMOGLOBIN: 11.8 g/dL (ref 11.7–15.5)
Lymphs Abs: 2359 cells/uL (ref 850–3900)
MCH: 27.6 pg (ref 27.0–33.0)
MCHC: 32.2 g/dL (ref 32.0–36.0)
MCV: 85.5 fL (ref 80.0–100.0)
MONOS PCT: 7.4 %
MPV: 10.4 fL (ref 7.5–12.5)
NEUTROS PCT: 57.2 %
Neutro Abs: 4004 cells/uL (ref 1500–7800)
Platelets: 375 10*3/uL (ref 140–400)
RBC: 4.28 10*6/uL (ref 3.80–5.10)
RDW: 13 % (ref 11.0–15.0)
TOTAL LYMPHOCYTE: 33.7 %
WBC mixed population: 518 cells/uL (ref 200–950)
WBC: 7 10*3/uL (ref 3.8–10.8)

## 2018-05-21 LAB — LIPID PANEL
CHOL/HDL RATIO: 3.2 (calc) (ref <5.0)
Cholesterol: 217 mg/dL — ABNORMAL HIGH (ref <200)
HDL: 68 mg/dL (ref >50)
LDL CHOLESTEROL (CALC): 130 mg/dL — AB
NON-HDL CHOLESTEROL (CALC): 149 mg/dL — AB (ref <130)
TRIGLYCERIDES: 86 mg/dL (ref <150)

## 2018-10-27 ENCOUNTER — Other Ambulatory Visit: Payer: Self-pay | Admitting: Gynecology

## 2018-10-27 DIAGNOSIS — Z1231 Encounter for screening mammogram for malignant neoplasm of breast: Secondary | ICD-10-CM

## 2018-12-16 ENCOUNTER — Ambulatory Visit
Admission: RE | Admit: 2018-12-16 | Discharge: 2018-12-16 | Disposition: A | Discharge status: Home / Self Care | Payer: BC Managed Care – PPO | Source: Ambulatory Visit | Attending: Gynecology | Admitting: Gynecology

## 2018-12-16 DIAGNOSIS — Z1231 Encounter for screening mammogram for malignant neoplasm of breast: Secondary | ICD-10-CM

## 2019-03-03 ENCOUNTER — Encounter: Payer: Self-pay | Admitting: Gynecology

## 2019-03-11 DIAGNOSIS — R87612 Low grade squamous intraepithelial lesion on cytologic smear of cervix (LGSIL): Secondary | ICD-10-CM

## 2019-03-11 HISTORY — DX: Low grade squamous intraepithelial lesion on cytologic smear of cervix (LGSIL): R87.612

## 2019-04-05 ENCOUNTER — Other Ambulatory Visit: Payer: Self-pay | Admitting: Gynecology

## 2019-04-05 NOTE — Telephone Encounter (Signed)
Annual scheduled on 04/07/19

## 2019-04-06 ENCOUNTER — Other Ambulatory Visit: Payer: Self-pay | Admitting: Gynecology

## 2019-04-06 ENCOUNTER — Other Ambulatory Visit: Payer: Self-pay

## 2019-04-07 ENCOUNTER — Ambulatory Visit: Payer: BC Managed Care – PPO | Admitting: Gynecology

## 2019-04-07 ENCOUNTER — Encounter: Payer: Self-pay | Admitting: Gynecology

## 2019-04-07 VITALS — BP 126/82 | Ht 62.5 in | Wt 249.0 lb

## 2019-04-07 DIAGNOSIS — Z13220 Encounter for screening for lipoid disorders: Secondary | ICD-10-CM | POA: Diagnosis not present

## 2019-04-07 DIAGNOSIS — Z01419 Encounter for gynecological examination (general) (routine) without abnormal findings: Secondary | ICD-10-CM | POA: Diagnosis not present

## 2019-04-07 DIAGNOSIS — A609 Anogenital herpesviral infection, unspecified: Secondary | ICD-10-CM

## 2019-04-07 DIAGNOSIS — Z79890 Hormone replacement therapy (postmenopausal): Secondary | ICD-10-CM | POA: Diagnosis not present

## 2019-04-07 DIAGNOSIS — Z1151 Encounter for screening for human papillomavirus (HPV): Secondary | ICD-10-CM

## 2019-04-07 MED ORDER — VALACYCLOVIR HCL 500 MG PO TABS: 500.0000 mg | ORAL_TABLET | Freq: Two times a day (BID) | ORAL | 3 refills | Status: DC

## 2019-04-07 MED ORDER — ESTRADIOL 0.5 MG PO TABS: 0.5000 mg | ORAL_TABLET | Freq: Every day | ORAL | 4 refills | Status: DC

## 2019-04-07 MED ORDER — PROGESTERONE MICRONIZED 100 MG PO CAPS: 100.0000 mg | ORAL_CAPSULE | Freq: Every day | ORAL | 4 refills | Status: DC

## 2019-04-07 NOTE — Addendum Note (Signed)
Addended by: Nelva Nay on: 04/07/2019 03:51 PM   Modules accepted: Orders

## 2019-04-07 NOTE — Patient Instructions (Signed)
Follow-up in 1 year for annual exam, sooner as needed. 

## 2019-04-07 NOTE — Progress Notes (Signed)
    Stacy Snyder 1971-05-24 [id number]        48 y.o.  G2P2 for annual gynecologic exam.  Without gynecologic complaints  Past medical history,surgical history, problem list, medications, allergies, family history and social history were all reviewed and documented as reviewed in the EPIC chart.  ROS:  Performed with pertinent positives and negatives included in the history, assessment and plan.   Additional significant findings : None   Exam: Caryn Bee assistant Vitals:   04/07/19 1518  BP: 126/82  Weight: 249 lb (112.9 kg)  Height: 5' 2.5" (1.588 m)   Body mass index is 44.82 kg/m.  General appearance:  Normal affect, orientation and appearance. Skin: Grossly normal HEENT: Without gross lesions.  No cervical or supraclavicular adenopathy. Thyroid normal.  Lungs:  Clear without wheezing, rales or rhonchi Cardiac: RR, without RMG Abdominal:  Soft, nontender, without masses, guarding, rebound, organomegaly or hernia Breasts:  Examined lying and sitting without masses, retractions, discharge or axillary adenopathy. Pelvic:  Ext, BUS, Vagina: Normal  Cervix: Normal.  Pap smear/HPV  Uterus: Difficult to palpate but no gross masses or tenderness.  Adnexa: Without masses or tenderness    Anus and perineum: Normal   Rectovaginal: Normal sphincter tone without palpated masses or tenderness.    Assessment/Plan:  48 y.o. G2P2 female for annual gynecologic exam.   1. Postmenopausal/HRT.  Continues on estradiol 0.5 mg and Prometrium 100 mg nightly.  Doing well.  No bleeding.  We again discussed the risks versus benefits to include the risks of thrombosis such as stroke heart attack DVT in the breast cancer issue.  At this point she is comfortable continuing I refilled her x1 year. 2. Mammography 12/2018.  Continue with annual mammography when due.  Breast exam normal today. 3. Pap smear/HPV 11/2014.  Pap smear/HPV today.  No history of significant abnormal Pap smears. 4. History of  HSV.  Uses Valtrex as needed.  Valtrex 500 mg twice daily times several days onset of outbreak.  #30 with 3 refills. 5. Health maintenance.  Requests baseline labs.  CBC, CMP, lipid profile and TSH ordered.  Follow-up 1 year, sooner as needed.   [name] MD, 3:43 PM 04/07/2019

## 2019-04-08 ENCOUNTER — Encounter: Payer: Self-pay | Admitting: Gynecology

## 2019-04-08 LAB — CBC WITH DIFFERENTIAL/PLATELET
Absolute Monocytes: 638 cells/uL (ref 200–950)
Basophils Absolute: 51 cells/uL (ref 0–200)
Basophils Relative: 0.6 %
Eosinophils Absolute: 68 cells/uL (ref 15–500)
Eosinophils Relative: 0.8 %
HCT: 35.6 % (ref 35.0–45.0)
Hemoglobin: 11.9 g/dL (ref 11.7–15.5)
Lymphs Abs: 3052 cells/uL (ref 850–3900)
MCH: 28.8 pg (ref 27.0–33.0)
MCHC: 33.4 g/dL (ref 32.0–36.0)
MCV: 86.2 fL (ref 80.0–100.0)
MPV: 11.2 fL (ref 7.5–12.5)
Monocytes Relative: 7.5 %
Neutro Abs: 4692 cells/uL (ref 1500–7800)
Neutrophils Relative %: 55.2 %
Platelets: 371 10*3/uL (ref 140–400)
RBC: 4.13 10*6/uL (ref 3.80–5.10)
RDW: 13.5 % (ref 11.0–15.0)
Total Lymphocyte: 35.9 %
WBC: 8.5 10*3/uL (ref 3.8–10.8)

## 2019-04-08 LAB — COMPREHENSIVE METABOLIC PANEL
AG Ratio: 1.7 (calc) (ref 1.0–2.5)
ALT: 13 U/L (ref 6–29)
AST: 23 U/L (ref 10–35)
Albumin: 4.2 g/dL (ref 3.6–5.1)
Alkaline phosphatase (APISO): 86 U/L (ref 31–125)
BUN: 17 mg/dL (ref 7–25)
CO2: 26 mmol/L (ref 20–32)
Calcium: 9.8 mg/dL (ref 8.6–10.2)
Chloride: 103 mmol/L (ref 98–110)
Creat: 0.92 mg/dL (ref 0.50–1.10)
Globulin: 2.5 g/dL (calc) (ref 1.9–3.7)
Glucose, Bld: 91 mg/dL (ref 65–99)
Potassium: 4.3 mmol/L (ref 3.5–5.3)
Sodium: 139 mmol/L (ref 135–146)
Total Bilirubin: 0.5 mg/dL (ref 0.2–1.2)
Total Protein: 6.7 g/dL (ref 6.1–8.1)

## 2019-04-08 LAB — PAP IG AND HPV HIGH-RISK: HPV DNA High Risk: NOT DETECTED

## 2019-04-08 LAB — TSH: TSH: 2.26 mIU/L

## 2019-04-08 LAB — LIPID PANEL
Cholesterol: 186 mg/dL (ref <200)
HDL: 57 mg/dL (ref >=50)
LDL Cholesterol (Calc): 105 mg/dL (calc) — ABNORMAL HIGH
Non-HDL Cholesterol (Calc): 129 mg/dL (calc) (ref <130)
Total CHOL/HDL Ratio: 3.3 (calc) (ref <5.0)
Triglycerides: 138 mg/dL (ref <150)

## 2019-04-09 ENCOUNTER — Other Ambulatory Visit: Payer: Self-pay

## 2019-04-09 MED ORDER — FLUCONAZOLE 150 MG PO TABS: 150.0000 mg | ORAL_TABLET | Freq: Once | ORAL | 0 refills | Status: AC

## 2019-05-04 ENCOUNTER — Other Ambulatory Visit: Payer: Self-pay | Admitting: Gynecology

## 2019-05-10 ENCOUNTER — Other Ambulatory Visit: Payer: Self-pay

## 2019-05-11 ENCOUNTER — Ambulatory Visit: Payer: BC Managed Care – PPO | Admitting: Gynecology

## 2019-05-11 ENCOUNTER — Encounter: Payer: Self-pay | Admitting: Gynecology

## 2019-05-11 VITALS — BP 130/80

## 2019-05-11 DIAGNOSIS — R87612 Low grade squamous intraepithelial lesion on cytologic smear of cervix (LGSIL): Secondary | ICD-10-CM

## 2019-05-11 DIAGNOSIS — N870 Mild cervical dysplasia: Secondary | ICD-10-CM | POA: Diagnosis not present

## 2019-05-11 NOTE — Progress Notes (Signed)
    Stacy Snyder Jul 16, 1970 [id number]        48 y.o.  G2P2 presents for colposcopy.  Recent Pap smear showed LGSIL negative high risk HPV.  No history of abnormal Pap smears previously.  Past medical history,surgical history, problem list, medications, allergies, family history and social history were all reviewed and documented in the EPIC chart.  Directed ROS with pertinent positives and negatives documented in the history of present illness/assessment and plan.  Exam: Stacy Snyder assistant Vitals:   05/11/19 1150  BP: 130/80   General appearance:  Normal Abdomen soft nontender without masses guarding rebound Pelvic external BUS vagina normal.  Cervix normal.  Uterus grossly normal midline mobile nontender.  Adnexa without masses or tenderness.  Colposcopy performed after acetic acid cleanse was inadequate.  Transformation zone not visualized 360 degrees.  No abnormalities noted on the ectocervix.  Cervical os dilated with disposable dilator and ECC performed.  Patient tolerated well.  Physical Exam  Genitourinary:        Assessment/Plan:  48 y.o. G2P2 with first abnormal Pap smear showing LGSIL negative high risk HPV.  We discussed dysplasia, high-grade/low-grade, progression/regression and the HPV association.  Colposcopy was inadequate as transformation zone not visualized 360 degrees.  No abnormalities were visualized.  ECC performed.  Patient will follow-up for results.  If negative or low-grade then plan expectant management with follow-up Pap smear 1 year.  If otherwise then will triage based upon results.    [name] MD, 12:22 PM 05/11/2019

## 2019-05-11 NOTE — Patient Instructions (Signed)
Office will call with biopsy results 

## 2019-05-13 ENCOUNTER — Encounter: Payer: Self-pay | Admitting: Gynecology

## 2019-05-13 LAB — TISSUE SPECIMEN

## 2019-05-13 LAB — PATHOLOGY REPORT

## 2020-01-04 ENCOUNTER — Other Ambulatory Visit: Payer: Self-pay | Admitting: Nurse Practitioner

## 2020-01-04 DIAGNOSIS — Z1231 Encounter for screening mammogram for malignant neoplasm of breast: Secondary | ICD-10-CM

## 2020-01-13 ENCOUNTER — Ambulatory Visit
Admission: RE | Admit: 2020-01-13 | Discharge: 2020-01-13 | Disposition: A | Discharge status: Home / Self Care | Payer: BC Managed Care – PPO | Source: Ambulatory Visit | Attending: Nurse Practitioner | Admitting: Nurse Practitioner

## 2020-01-13 ENCOUNTER — Other Ambulatory Visit: Payer: Self-pay

## 2020-01-13 DIAGNOSIS — Z1231 Encounter for screening mammogram for malignant neoplasm of breast: Secondary | ICD-10-CM

## 2020-04-10 ENCOUNTER — Other Ambulatory Visit: Payer: Self-pay

## 2020-06-26 ENCOUNTER — Ambulatory Visit: Payer: BC Managed Care – PPO | Admitting: Nurse Practitioner

## 2020-07-03 ENCOUNTER — Encounter: Payer: BC Managed Care – PPO | Admitting: Obstetrics and Gynecology

## 2020-07-03 ENCOUNTER — Encounter: Payer: BC Managed Care – PPO | Admitting: Nurse Practitioner

## 2020-07-06 ENCOUNTER — Other Ambulatory Visit: Payer: Self-pay

## 2020-07-27 NOTE — Progress Notes (Signed)
50 y.o. G2P2 Single White or Caucasian female here for annual exam.   Gastric Bypass surgery 2005. Pt went through early menopause at age 47. Has been on HRT since then  Ran out of estradiol 0.$RemoveBefore'50mg'[id number]$  and Prometrium 100 mg, and tried to come off but started having a lot of hot flashes and would like to restart. Discussed risks.  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    Patient's last menstrual period was 05/10/2009.          Sexually active: Yes.    The current method of family planning is tubal ligation.    Exercising: Yes.    walking Smoker:  no  Health Maintenance: Pap:  04-07-2019 LGSIL HPV HR neg History of abnormal Pap:  yes MMG:  01-16-2020 category a density birads 1:neg Colonoscopy:  cologuard 2021 neg BMD:   11-17-2006 TDaP:  unsure Gardasil:   n/a Covid-19: pfizer Hep C testing: not done Screening Labs: Would like them done here   reports that she has never smoked. She has never used smokeless tobacco. She reports previous alcohol use. She reports that she does not use drugs.  Past Medical History:  Diagnosis Date  . Bursitis   . HSV-1 (herpes simplex virus 1) infection   . 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 Obesity   . Seasonal allergies     Past Surgical History:  Procedure Laterality Date  . CESAREAN SECTION     X2  . GASTRIC BYPASS  2005  . TUBAL LIGATION      Current Outpatient Medications  Medication Sig Dispense Refill  . calcium-vitamin D 250-100 MG-UNIT per tablet Take 1 tablet by mouth 2 (two) times daily.    . Cholecalciferol (VITAMIN D PO) Take by mouth.    . Cranberry 300 MG tablet Take 300 mg by mouth daily.    Marland Kitchen loratadine (CLARITIN) 10 MG tablet Take 10 mg by mouth daily.    . Multiple Vitamin (MULTIVITAMIN) capsule Take 1 capsule by mouth daily.    Marland Kitchen  OLIVE LEAF EXTRACT PO Take by mouth.    . TURMERIC CURCUMIN PO Take by mouth.    . valACYclovir (VALTREX) 500 MG tablet TAKE 1 TABLET BY MOUTH TWICE DAILY FOR 3 TO 5 DAYS THEN DAILY AS NEEDED 30 tablet 3  . estradiol (ESTRACE) 0.5 MG tablet Take 1 tablet (0.5 mg total) by mouth daily. (Patient not taking: Reported on 07/28/2020) 90 tablet 4  . progesterone (PROMETRIUM) 100 MG capsule Take 1 capsule (100 mg total) by mouth at bedtime. (Patient not taking: Reported on 07/28/2020) 90 capsule 4   No current facility-administered medications for this visit.    Family History  Problem Relation Age of Onset  . Diabetes Maternal Grandmother   . Hypertension Maternal Grandmother   . Heart disease Maternal Grandmother   . Diabetes Paternal Grandmother   . Hypertension Paternal Grandmother   . Heart disease Paternal Grandfather     Review of Systems  Constitutional: Negative.   HENT: Negative.   Eyes: Negative.   Respiratory: Negative.   Cardiovascular: Negative.   Gastrointestinal: Negative.   Endocrine: Negative.   Genitourinary: Negative.   Musculoskeletal: Negative.   Skin: Negative.   Allergic/Immunologic: Negative.   Neurological: Negative.   Hematological: Negative.   Psychiatric/Behavioral: Negative.     Exam:   BP 112/70  Pulse 72   Resp 16   Ht 5' 2.5" (1.588 m)   Wt 242 lb (109.8 kg)   LMP 05/10/2009   BMI 43.56 kg/m   Height: 5' 2.5" (158.8 cm)  General appearance: alert, cooperative and appears stated age, no acute distress Head: Normocephalic, without obvious abnormality Neck: no adenopathy, thyroid normal to inspection and palpation Lungs: clear to auscultation bilaterally Breasts: normal appearance, no masses or tenderness Heart: regular rate and rhythm Abdomen: soft, non-tender; no masses,  no organomegaly Extremities: extremities normal, no edema Skin: No rashes or lesions Lymph nodes: Cervical, supraclavicular, and axillary nodes normal. No abnormal  inguinal nodes palpated Neurologic: Grossly normal   Pelvic: External genitalia:  no lesions              Urethra:  normal appearing urethra with no masses, tenderness or lesions              Bartholins and Skenes: normal                 Vagina: normal appearing vagina, appropriate for age, normal appearing discharge, no lesions              Cervix: neg cervical motion tenderness, no visible lesions             Bimanual Exam:   Uterus:  normal size, contour, position, consistency, mobility, non-tender              Adnexa: no mass, fullness, tenderness               Rectal: no palpable mass  Joy, CMA Chaperone was present for exam.  A:  Well female exam with routine gynecological exam - Plan: Cytology - PAP( [facility])  Morbid obesity ([place]) - Plan: Comp Met (CMET), Lipid panel, CBC, TSH, VITAMIN D 25 Hydroxy (Vit-D Deficiency, Fractures)  Menopause syndrome - Plan: estradiol (CLIMARA - DOSED IN MG/24 HR) 0.025 mg/24hr patch, progesterone (PROMETRIUM) 100 MG capsule  P:   Screening labs drawn today  Pap Smear done today  Mammogram due 01/2021  Colonoscopy due 2024  Dexa Scan due by age 70

## 2020-07-28 ENCOUNTER — Other Ambulatory Visit (HOSPITAL_COMMUNITY)
Admission: RE | Admit: 2020-07-28 | Discharge: 2020-07-28 | Disposition: A | Discharge status: Home / Self Care | Payer: BC Managed Care – PPO | Source: Ambulatory Visit | Attending: Nurse Practitioner | Admitting: Nurse Practitioner

## 2020-07-28 ENCOUNTER — Other Ambulatory Visit: Payer: Self-pay

## 2020-07-28 ENCOUNTER — Ambulatory Visit: Payer: BC Managed Care – PPO | Admitting: Nurse Practitioner

## 2020-07-28 ENCOUNTER — Encounter: Payer: Self-pay | Admitting: Nurse Practitioner

## 2020-07-28 VITALS — BP 112/70 | HR 72 | Resp 16 | Ht 62.5 in | Wt 242.0 lb

## 2020-07-28 DIAGNOSIS — N951 Menopausal and female climacteric states: Secondary | ICD-10-CM

## 2020-07-28 DIAGNOSIS — Z01419 Encounter for gynecological examination (general) (routine) without abnormal findings: Secondary | ICD-10-CM

## 2020-07-28 LAB — CBC: MCHC: 33.8 g/dL (ref 32.0–36.0)

## 2020-07-28 MED ORDER — ESTRADIOL 0.025 MG/24HR TD PTWK: 0.0250 mg | MEDICATED_PATCH | TRANSDERMAL | 12 refills | Status: DC

## 2020-07-28 MED ORDER — VALACYCLOVIR HCL 500 MG PO TABS: 500.0000 mg | ORAL_TABLET | Freq: Two times a day (BID) | ORAL | 3 refills | Status: AC

## 2020-07-28 MED ORDER — PROGESTERONE MICRONIZED 100 MG PO CAPS: 100.0000 mg | ORAL_CAPSULE | Freq: Every day | ORAL | 12 refills | Status: DC

## 2020-07-28 NOTE — Patient Instructions (Addendum)
Pap Smear done today Mammogram due 01/2021 Colonoscopy due 2024 Dexa Scan due by age 50  Health Maintenance, Female Adopting a healthy lifestyle and getting preventive care are important in promoting health and wellness. Ask your health care provider about:  The right schedule for you to have regular tests and exams.  Things you can do on your own to prevent diseases and keep yourself healthy. What should I know about diet, weight, and exercise? Eat a healthy diet  Eat a diet that includes plenty of vegetables, fruits, low-fat dairy products, and lean protein.  Do not eat a lot of foods that are high in solid fats, added sugars, or sodium.   Maintain a healthy weight Body mass index (BMI) is used to identify weight problems. It estimates body fat based on height and weight. Your health care provider can help determine your BMI and help you achieve or maintain a healthy weight. Get regular exercise Get regular exercise. This is one of the most important things you can do for your health. Most adults should:  Exercise for at least 150 minutes each week. The exercise should increase your heart rate and make you sweat (moderate-intensity exercise).  Do strengthening exercises at least twice a week. This is in addition to the moderate-intensity exercise.  Spend less time sitting. Even light physical activity can be beneficial. Watch cholesterol and blood lipids Have your blood tested for lipids and cholesterol at 51 years of age, then have this test every 5 years. Have your cholesterol levels checked more often if:  Your lipid or cholesterol levels are high.  You are older than 51 years of age.  You are at high risk for heart disease. What should I know about cancer screening? Depending on your health history and family history, you may need to have cancer screening at various ages. This may include screening for:  Breast cancer.  Cervical cancer.  Colorectal cancer.  Skin  cancer.  Lung cancer. What should I know about heart disease, diabetes, and high blood pressure? Blood pressure and heart disease  High blood pressure causes heart disease and increases the risk of stroke. This is more likely to develop in people who have high blood pressure readings, are of African descent, or are overweight.  Have your blood pressure checked: ? Every 3-5 years if you are 51-67 years of age. ? Every year if you are 51 years old or older. Diabetes Have regular diabetes screenings. This checks your fasting blood sugar level. Have the screening done:  Once every three years after age 90 if you are at a normal weight and have a low risk for diabetes.  More often and at a younger age if you are overweight or have a high risk for diabetes. What should I know about preventing infection? Hepatitis B If you have a higher risk for hepatitis B, you should be screened for this virus. Talk with your health care provider to find out if you are at risk for hepatitis B infection. Hepatitis C Testing is recommended for:  Everyone born from [birth year] through 1965.  Anyone with known risk factors for hepatitis C. Sexually transmitted infections (STIs)  Get screened for STIs, including gonorrhea and chlamydia, if: ? You are sexually active and are younger than 51 years of age. ? You are older than 51 years of age and your health care provider tells you that you are at risk for this type of infection. ? Your sexual activity has changed since you were  last screened, and you are at increased risk for chlamydia or gonorrhea. Ask your health care provider if you are at risk.  Ask your health care provider about whether you are at high risk for HIV. Your health care provider may recommend a prescription medicine to help prevent HIV infection. If you choose to take medicine to prevent HIV, you should first get tested for HIV. You should then be tested every 3 months for as long as you are taking  the medicine. Pregnancy  If you are about to stop having your period (premenopausal) and you may become pregnant, seek counseling before you get pregnant.  Take 400 to 800 micrograms (mcg) of folic acid every day if you become pregnant.  Ask for birth control (contraception) if you want to prevent pregnancy. Osteoporosis and menopause Osteoporosis is a disease in which the bones lose minerals and strength with aging. This can result in bone fractures. If you are 83 years old or older, or if you are at risk for osteoporosis and fractures, ask your health care provider if you should:  Be screened for bone loss.  Take a calcium or vitamin D supplement to lower your risk of fractures.  Be given hormone replacement therapy (HRT) to treat symptoms of menopause. Follow these instructions at home: Lifestyle  Do not use any products that contain nicotine or tobacco, such as cigarettes, e-cigarettes, and chewing tobacco. If you need help quitting, ask your health care provider.  Do not use street drugs.  Do not share needles.  Ask your health care provider for help if you need support or information about quitting drugs. Alcohol use  Do not drink alcohol if: ? Your health care provider tells you not to drink. ? You are pregnant, may be pregnant, or are planning to become pregnant.  If you drink alcohol: ? Limit how much you use to 0-1 drink a day. ? Limit intake if you are breastfeeding.  Be aware of how much alcohol is in your drink. In the [place] one drink equals one 12 oz bottle of beer (355 mL), one 5 oz glass of wine (148 mL), or one 1 oz glass of hard liquor (44 mL). General instructions  Schedule regular health, dental, and eye exams.  Stay current with your vaccines.  Tell your health care provider if: ? You often feel depressed. ? You have ever been abused or do not feel safe at home. Summary  Adopting a healthy lifestyle and getting preventive care are important in  promoting health and wellness.  Follow your health care provider's instructions about healthy diet, exercising, and getting tested or screened for diseases.  Follow your health care provider's instructions on monitoring your cholesterol and blood pressure. This information is not intended to replace advice given to you by your health care provider. Make sure you discuss any questions you have with your health care provider. Document Revised: 05/20/2018 Document Reviewed: 05/20/2018 Elsevier Patient Education  2021 [employer].

## 2020-07-29 LAB — COMPREHENSIVE METABOLIC PANEL
AG Ratio: 1.8 (calc) (ref 1.0–2.5)
ALT: 13 U/L (ref 6–29)
AST: 21 U/L (ref 10–35)
Albumin: 4.4 g/dL (ref 3.6–5.1)
Alkaline phosphatase (APISO): 95 U/L (ref 37–153)
BUN: 13 mg/dL (ref 7–25)
CO2: 28 mmol/L (ref 20–32)
Calcium: 9.4 mg/dL (ref 8.6–10.4)
Chloride: 103 mmol/L (ref 98–110)
Creat: 0.81 mg/dL (ref 0.50–1.05)
Globulin: 2.4 g/dL (calc) (ref 1.9–3.7)
Glucose, Bld: 82 mg/dL (ref 65–99)
Potassium: 4.2 mmol/L (ref 3.5–5.3)
Sodium: 139 mmol/L (ref 135–146)
Total Bilirubin: 0.5 mg/dL (ref 0.2–1.2)
Total Protein: 6.8 g/dL (ref 6.1–8.1)

## 2020-07-29 LAB — CBC
HCT: 35.8 % (ref 35.0–45.0)
Hemoglobin: 12.1 g/dL (ref 11.7–15.5)
MCH: 28.4 pg (ref 27.0–33.0)
MCV: 84 fL (ref 80.0–100.0)
MPV: 9.9 fL (ref 7.5–12.5)
Platelets: 352 10*3/uL (ref 140–400)
RBC: 4.26 10*6/uL (ref 3.80–5.10)
RDW: 13.4 % (ref 11.0–15.0)
WBC: 6.1 10*3/uL (ref 3.8–10.8)

## 2020-07-29 LAB — LIPID PANEL
Cholesterol: 201 mg/dL — ABNORMAL HIGH (ref <200)
HDL: 75 mg/dL (ref >=50)
LDL Cholesterol (Calc): 109 mg/dL (calc) — ABNORMAL HIGH
Non-HDL Cholesterol (Calc): 126 mg/dL (calc) (ref <130)
Total CHOL/HDL Ratio: 2.7 (calc) (ref <5.0)
Triglycerides: 80 mg/dL (ref <150)

## 2020-07-29 LAB — TSH: TSH: 2.54 mIU/L

## 2020-07-29 LAB — VITAMIN D 25 HYDROXY (VIT D DEFICIENCY, FRACTURES): Vit D, 25-Hydroxy: 43 ng/mL (ref 30–100)

## 2020-08-01 LAB — CYTOLOGY - PAP
Comment: NEGATIVE
Diagnosis: NEGATIVE
High risk HPV: NEGATIVE

## 2020-08-07 ENCOUNTER — Telehealth: Payer: Self-pay | Admitting: *Deleted

## 2020-08-07 ENCOUNTER — Other Ambulatory Visit: Payer: Self-pay | Admitting: Nurse Practitioner

## 2020-08-07 DIAGNOSIS — N951 Menopausal and female climacteric states: Secondary | ICD-10-CM

## 2020-08-07 MED ORDER — ESTRADIOL 0.5 MG PO TABS: 0.5000 mg | ORAL_TABLET | Freq: Every day | ORAL | 4 refills | Status: DC

## 2020-08-07 NOTE — Telephone Encounter (Signed)
Patient had annual exam on 07/28/20 was switched to climara patch 0.025 mg, patient called today asking if she can go back taking oral estradiol. Reports the patch is causing her to itch all the time. Please advise

## 2020-08-07 NOTE — Progress Notes (Signed)
Request to change back to oral estradiol. She is having itching reaction to the patch. Rx sent as requested. Pt called and notified. Karma Ganja, [facility]

## 2020-08-11 NOTE — Telephone Encounter (Signed)
Per kelly note "Request to change back to oral estradiol. She is having itching reaction to the patch. Rx sent as requested. Pt called and notified. Karma Ganja, [facility]"

## 2021-01-10 ENCOUNTER — Other Ambulatory Visit: Payer: Self-pay | Admitting: Nurse Practitioner

## 2021-01-10 DIAGNOSIS — Z1231 Encounter for screening mammogram for malignant neoplasm of breast: Secondary | ICD-10-CM

## 2021-01-18 ENCOUNTER — Other Ambulatory Visit: Payer: Self-pay

## 2021-01-18 ENCOUNTER — Ambulatory Visit
Admission: RE | Admit: 2021-01-18 | Discharge: 2021-01-18 | Disposition: A | Discharge status: Home / Self Care | Payer: BC Managed Care – PPO | Source: Ambulatory Visit | Attending: Nurse Practitioner | Admitting: Nurse Practitioner

## 2021-01-18 DIAGNOSIS — Z1231 Encounter for screening mammogram for malignant neoplasm of breast: Secondary | ICD-10-CM

## 2021-03-26 ENCOUNTER — Other Ambulatory Visit: Payer: Self-pay | Admitting: *Deleted

## 2021-03-26 NOTE — Telephone Encounter (Signed)
Patient returned call, left message to return call.   Call returned to patient, Left message to call [facility] Triage [phone], OPT 4.   Routing to [employer] Triage

## 2021-03-26 NOTE — Telephone Encounter (Signed)
Call returned to patient.  Patient left message on 03/23/21 requesting Rx change. No details provided.   Call placed to patient, Left message to call  [facility] triage, [phone], OPT 4  Routing to [facility] Triage

## 2021-04-11 NOTE — Telephone Encounter (Signed)
When seeing Dr. Loetta Rough Valacyclovir. He wrote it  "Sig: TAKE 1 TABLET BY MOUTH TWICE DAILY FOR 3 TO 5 DAYS THEN DAILY AS NEEDED.  However when she saw Karma Ganja, NP she wrote just enough for her to treat the outbreak.  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 She would like to have Valacyclovir prescribed so she can take it daily and treat outbreaks If they occur.

## 2021-04-12 ENCOUNTER — Other Ambulatory Visit: Payer: Self-pay | Admitting: Obstetrics & Gynecology

## 2021-04-12 MED ORDER — VALACYCLOVIR HCL 1 G PO TABS: 1000.0000 mg | ORAL_TABLET | Freq: Two times a day (BID) | ORAL | 3 refills | Status: DC

## 2021-04-12 MED ORDER — VALACYCLOVIR HCL 500 MG PO TABS: ORAL_TABLET | ORAL | 4 refills | Status: DC

## 2021-10-11 ENCOUNTER — Other Ambulatory Visit: Payer: Self-pay

## 2021-10-11 DIAGNOSIS — N951 Menopausal and female climacteric states: Secondary | ICD-10-CM

## 2021-10-11 NOTE — Telephone Encounter (Signed)
Last AEX 07/28/20--scheduled 10/16/21. ?Last mammo 01/18/21.  ?

## 2021-10-12 MED ORDER — ESTRADIOL 0.5 MG PO TABS: 0.5000 mg | ORAL_TABLET | Freq: Every day | ORAL | 0 refills | Status: DC

## 2021-10-16 ENCOUNTER — Other Ambulatory Visit (HOSPITAL_COMMUNITY)
Admission: RE | Admit: 2021-10-16 | Discharge: 2021-10-16 | Disposition: A | Discharge status: Home / Self Care | Payer: BC Managed Care – PPO | Source: Ambulatory Visit | Attending: Obstetrics & Gynecology | Admitting: Obstetrics & Gynecology

## 2021-10-16 ENCOUNTER — Ambulatory Visit (INDEPENDENT_AMBULATORY_CARE_PROVIDER_SITE_OTHER): Payer: BC Managed Care – PPO | Admitting: Obstetrics & Gynecology

## 2021-10-16 ENCOUNTER — Encounter: Payer: Self-pay | Admitting: Obstetrics & Gynecology

## 2021-10-16 VITALS — BP 118/78 | HR 74 | Resp 16 | Ht 62.75 in | Wt 264.0 lb

## 2021-10-16 DIAGNOSIS — Z79890 Hormone replacement therapy (postmenopausal): Secondary | ICD-10-CM

## 2021-10-16 DIAGNOSIS — R87612 Low grade squamous intraepithelial lesion on cytologic smear of cervix (LGSIL): Secondary | ICD-10-CM | POA: Diagnosis not present

## 2021-10-16 DIAGNOSIS — Z01419 Encounter for gynecological examination (general) (routine) without abnormal findings: Secondary | ICD-10-CM | POA: Diagnosis not present

## 2021-10-16 DIAGNOSIS — N951 Menopausal and female climacteric states: Secondary | ICD-10-CM

## 2021-10-16 MED ORDER — PROGESTERONE MICRONIZED 100 MG PO CAPS: 100.0000 mg | ORAL_CAPSULE | Freq: Every day | ORAL | 4 refills | Status: DC

## 2021-10-16 MED ORDER — ESTRADIOL 0.5 MG PO TABS: 0.5000 mg | ORAL_TABLET | Freq: Every day | ORAL | 4 refills | Status: DC

## 2021-10-16 NOTE — Progress Notes (Signed)
? ? ?Stacy Snyder [birth year] [id number] ? ? ?History:    51 y.o. G2P2L2  Married.  S/P TL.  School principal. ? ?RP:  Established patient presenting for annual gyn exam  ? ?HPI: Postmenopause since age 32.  Well on HRT with Estradiol 0.5 mg 1 tab PO daily and Prometrium 100 mg PO HS.  No PMB.  No pelvic pain.  No pain with IC.  Pap 07/2020 Neg.  LGSIL/HPV HR Neg in 2020.  Colpo No Dysplasia in 2020. Pap reflex today.  Breasts normal.  Mammo Neg 01/2021. Gastric Bypass surgery 2005.  ColoGuard Neg about 2 yrs ago.  Recommend Colonoscopy.  Health labs with Fam MD.  ? ? ?Past medical history,surgical history, family history and social history were all reviewed and documented in the EPIC chart. ? ?Gynecologic History ?Patient's last menstrual period was 05/10/2009. ? ?Obstetric History ?OB History  ?Gravida Para Term Preterm AB Living  ?'2 2       2  '$ ?SAB IAB Ectopic Multiple Live Births  ?           ?  ?# Outcome Date GA Lbr Len/2nd Weight Sex Delivery Anes PTL Lv  ?2 Para           ?1 Para           ? ? ? ?ROS: A ROS was performed and pertinent positives and negatives are included in the history. ? GENERAL: No fevers or chills. HEENT: No change in vision, no earache, sore throat or sinus congestion. NECK: No pain or stiffness. CARDIOVASCULAR: No chest pain or pressure. No palpitations. PULMONARY: No shortness of breath, cough or wheeze. GASTROINTESTINAL: No abdominal pain, nausea, vomiting or diarrhea, melena or bright red blood per rectum. GENITOURINARY: No urinary frequency, urgency, hesitancy or dysuria. MUSCULOSKELETAL: No joint or muscle pain, no back pain, no recent trauma. DERMATOLOGIC: No rash, no itching, no lesions. ENDOCRINE: No polyuria, polydipsia, no heat or cold intolerance. No recent change in weight. HEMATOLOGICAL: No anemia or easy bruising or bleeding. NEUROLOGIC: No headache, seizures, numbness, tingling or weakness. PSYCHIATRIC: No depression, no loss of interest in normal activity or change in  sleep pattern.  ?  ? ?Exam: ? ? ?BP 118/78   Pulse 74   Resp 16   Ht 5' 2.75" (1.594 m)   Wt 264 lb (119.7 kg)   LMP 05/10/2009   BMI 47.14 kg/m?  ? ?Body mass index is 47.14 kg/m?. ? ?General appearance : Well developed well nourished female. No acute distress ?HEENT: Eyes: no retinal hemorrhage or exudates,  Neck supple, trachea midline, no carotid bruits, no thyroidmegaly ?Lungs: Clear to auscultation, no rhonchi or wheezes, or rib retractions  ?Heart: Regular rate and rhythm, no murmurs or gallops ?Breast:Examined in sitting and supine position were symmetrical in appearance, no palpable masses or tenderness,  no skin retraction, no nipple inversion, no nipple discharge, no skin discoloration, no axillary or supraclavicular lymphadenopathy ?Abdomen: no palpable masses or tenderness, no rebound or guarding ?Extremities: no edema or skin discoloration or tenderness ? ?Pelvic: Vulva: Normal ?            Vagina: No gross lesions or discharge ? Cervix: No gross lesions or discharge.  Pap reflex done. ? Uterus  AV, normal size, shape and consistency, non-tender and mobile ? Adnexa  Without masses or tenderness ? Anus: Normal ? ? ?Assessment/Plan:  51 y.o. female for annual exam  ? ?1. Encounter for routine gynecological examination with Papanicolaou smear of cervix ?Postmenopause  since age 31.  Well on HRT with Estradiol 0.5 mg 1 tab PO daily and Prometrium 100 mg PO HS.  No PMB.  No pelvic pain.  No pain with IC.  Pap 07/2020 Neg.  LGSIL/HPV HR Neg in 2020.  Colpo No Dysplasia in 2020. Pap reflex today.  Breasts normal.  Mammo Neg 01/2021. Gastric Bypass surgery 2005.  ColoGuard Neg about 2 yrs ago.  Recommend Colonoscopy.  Health labs with Fam MD.  ?- Cytology - PAP( [place]) ? 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 ?3. Postmenopausal hormone replacement therapy ?Postmenopause since age 21.  Well on HRT with Estradiol 0.5 mg 1 tab PO daily and Prometrium 100 mg PO HS.  No PMB.  No  pelvic pain.  No pain with IC.  Understands the risks/benefits.  No CI.  Continue on same regimen.  Prescriptions sent to pharmacy. ? ?4. Morbid obesity ([place]) ?Low calorie/carb diet. Intermittent fasting. Increase fitness activities.   ? ?Orders: ?- estradiol (ESTRACE) 0.5 MG tablet; Take 1 tablet (0.5 mg total) by mouth daily. ?- progesterone (PROMETRIUM) 100 MG capsule; Take 1 capsule (100 mg total) by mouth at bedtime. ?- CALCIUM-VITAMIN D PO; Take by mouth. Takes 2  ? ?Princess Bruins MD, 8:19 AM 10/16/2021 ? ?  ?

## 2021-10-18 LAB — CYTOLOGY - PAP

## 2021-10-26 ENCOUNTER — Other Ambulatory Visit: Payer: Self-pay | Admitting: *Deleted

## 2021-10-26 ENCOUNTER — Telehealth: Payer: Self-pay | Admitting: *Deleted

## 2021-10-26 DIAGNOSIS — Z13220 Encounter for screening for lipoid disorders: Secondary | ICD-10-CM

## 2021-10-26 DIAGNOSIS — R87612 Low grade squamous intraepithelial lesion on cytologic smear of cervix (LGSIL): Secondary | ICD-10-CM

## 2021-10-26 DIAGNOSIS — E559 Vitamin D deficiency, unspecified: Secondary | ICD-10-CM

## 2021-10-26 DIAGNOSIS — Z01419 Encounter for gynecological examination (general) (routine) without abnormal findings: Secondary | ICD-10-CM

## 2021-10-26 NOTE — Telephone Encounter (Signed)
Patient has C&B scheduled on 11/07/21, asked if annual exam labs done drawn that day? Patient had annual exam on 10/16/21 and no labs ordered. She no longer has PCP. Please advise

## 2021-10-26 NOTE — Telephone Encounter (Signed)
Dr.Lavoie replied "Agree with [facility] Labs:  FLP, TSH, CMP, CBC, Vit D at Colpo visit. "    Dr.Lavoie I signed FLP,CBC,CMET orders. Please confirm diagnosis for TSH and Vitamin D. Orders are pending to be signed.

## 2021-10-31 NOTE — Telephone Encounter (Signed)
Dr.Lavoie replied "H/O Low Vit D.  No specific Dx for TSH, put it under health labs. "  Orders signed.

## 2021-11-07 ENCOUNTER — Other Ambulatory Visit: Payer: BC Managed Care – PPO

## 2021-11-07 ENCOUNTER — Encounter: Payer: Self-pay | Admitting: Obstetrics & Gynecology

## 2021-11-07 ENCOUNTER — Ambulatory Visit: Payer: BC Managed Care – PPO | Admitting: Obstetrics & Gynecology

## 2021-11-07 ENCOUNTER — Other Ambulatory Visit (HOSPITAL_COMMUNITY)
Admission: RE | Admit: 2021-11-07 | Discharge: 2021-11-07 | Disposition: A | Discharge status: Home / Self Care | Payer: BC Managed Care – PPO | Source: Ambulatory Visit | Attending: Obstetrics & Gynecology | Admitting: Obstetrics & Gynecology

## 2021-11-07 DIAGNOSIS — R87612 Low grade squamous intraepithelial lesion on cytologic smear of cervix (LGSIL): Secondary | ICD-10-CM

## 2021-11-07 DIAGNOSIS — Z13220 Encounter for screening for lipoid disorders: Secondary | ICD-10-CM

## 2021-11-07 DIAGNOSIS — E559 Vitamin D deficiency, unspecified: Secondary | ICD-10-CM

## 2021-11-07 DIAGNOSIS — Z01419 Encounter for gynecological examination (general) (routine) without abnormal findings: Secondary | ICD-10-CM

## 2021-11-07 NOTE — Progress Notes (Signed)
    Stacy Snyder [birth year] [id number]        51 y.o.  [place] Married.  S/P TL.  School principal.  RP: LGSIL for Colposcopy  HPI:  Pap 10/16/2021 LGSIL.  Postmenopause since age 37.  Well on HRT with Estradiol 0.5 mg 1 tab PO daily and Prometrium 100 mg PO HS.  No PMB.  No pelvic pain.  No pain with IC.  Pap 07/2020 Neg.  LGSIL/HPV HR Neg in 2020.  Colpo No Dysplasia in 2020.   OB History  Gravida Para Term Preterm AB Living  '2 2       2  '$ SAB IAB Ectopic Multiple Live Births               # Outcome Date GA Lbr Len/2nd Weight Sex Delivery Anes PTL Lv  2 Para           1 Para             Past medical history,surgical history, problem list, medications, allergies, family history and social history were all reviewed and documented in the EPIC chart.   Directed ROS with pertinent positives and negatives documented in the history of present illness/assessment and plan.  Exam:  Vitals:   11/07/21 1059  BP: 120/74   General appearance:  Normal  Colposcopy Procedure Note BRITA JURGENSEN 11/07/2021  Indications:  LGSIL  Procedure Details  The risks and benefits of the procedure and Written informed consent obtained.  Speculum placed in vagina and excellent visualization of cervix achieved, cervix swabbed x 3 with acetic acid solution.  Findings:  Cervix colposcopy:  Physical Exam Genitourinary:       Vaginal colposcopy: Normal  Vulvar colposcopy: Normal  Perirectal colposcopy: Normal  The cervix was sprayed with Hurricane before performing the cervical biopsies.  Specimens: HR HPV/reflex 16-18-45.  Cervical Bxs at 3 and 9 O'Clock.  Complications:  None, good hemostasis with Silver Nitrate. . Plan:  Management per results   Assessment/Plan:  51 y.o. G2P2   1. LGSIL on Pap smear of cervix Pap 10/16/2021 LGSIL.  Postmenopause since age 74.  Well on HRT with Estradiol 0.5 mg 1 tab PO daily and Prometrium 100 mg PO HS.  No PMB.  No pelvic pain.  No pain with IC.   Pap 07/2020 Neg.  LGSIL/HPV HR Neg in 2020.  Colpo No Dysplasia in 2020.  Counseling on abnormal Pap.  HPV HR/Reflex 16-18-45 done.  Colposcopy findings thoroughly reviewed with patient.  Management per results. - Colposcopy - Cervicovaginal ancillary only( [place]) - Surgical pathology( [place]/ [facility])   Princess Bruins MD, 11:28 AM 11/07/2021

## 2021-11-09 LAB — SURGICAL PATHOLOGY

## 2021-11-10 LAB — COMPREHENSIVE METABOLIC PANEL
AG Ratio: 1.6 (calc) (ref 1.0–2.5)
ALT: 12 U/L (ref 6–29)
AST: 18 U/L (ref 10–35)
Albumin: 4.3 g/dL (ref 3.6–5.1)
Alkaline phosphatase (APISO): 101 U/L (ref 37–153)
BUN: 12 mg/dL (ref 7–25)
CO2: 29 mmol/L (ref 20–32)
Calcium: 9.7 mg/dL (ref 8.6–10.4)
Chloride: 104 mmol/L (ref 98–110)
Creat: 0.78 mg/dL (ref 0.50–1.03)
Globulin: 2.7 g/dL (calc) (ref 1.9–3.7)
Glucose, Bld: 84 mg/dL (ref 65–99)
Potassium: 4.3 mmol/L (ref 3.5–5.3)
Sodium: 141 mmol/L (ref 135–146)
Total Bilirubin: 0.4 mg/dL (ref 0.2–1.2)
Total Protein: 7 g/dL (ref 6.1–8.1)

## 2021-11-10 LAB — TSH: TSH: 2.32 mIU/L

## 2021-11-10 LAB — CBC
HCT: 35.3 % (ref 35.0–45.0)
Hemoglobin: 11.8 g/dL (ref 11.7–15.5)
MCH: 28.4 pg (ref 27.0–33.0)
MCHC: 33.4 g/dL (ref 32.0–36.0)
MCV: 84.9 fL (ref 80.0–100.0)
MPV: 10.7 fL (ref 7.5–12.5)
Platelets: 372 10*3/uL (ref 140–400)
RBC: 4.16 10*6/uL (ref 3.80–5.10)
RDW: 13.3 % (ref 11.0–15.0)
WBC: 6.8 10*3/uL (ref 3.8–10.8)

## 2021-11-10 LAB — LIPID PANEL
Cholesterol: 199 mg/dL (ref <200)
HDL: 62 mg/dL (ref >=50)
LDL Cholesterol (Calc): 115 mg/dL (calc) — ABNORMAL HIGH
Non-HDL Cholesterol (Calc): 137 mg/dL (calc) — ABNORMAL HIGH (ref <130)
Total CHOL/HDL Ratio: 3.2 (calc) (ref <5.0)
Triglycerides: 117 mg/dL (ref <150)

## 2021-11-10 LAB — VITAMIN D 1,25 DIHYDROXY
Vitamin D 1, 25 (OH)2 Total: 71 pg/mL (ref 18–72)
Vitamin D2 1, 25 (OH)2: <8 pg/mL
Vitamin D3 1, 25 (OH)2: 71 pg/mL

## 2021-11-13 ENCOUNTER — Encounter: Payer: Self-pay | Admitting: Obstetrics & Gynecology

## 2021-11-13 LAB — CERVICOVAGINAL ANCILLARY ONLY
Comment: NEGATIVE
High risk HPV: NEGATIVE

## 2021-11-16 ENCOUNTER — Telehealth: Payer: Self-pay | Admitting: *Deleted

## 2021-11-16 DIAGNOSIS — Z1211 Encounter for screening for malignant neoplasm of colon: Secondary | ICD-10-CM

## 2021-11-16 NOTE — Telephone Encounter (Signed)
Patient called requesting refill on colonoscopy. I asked patient to call me back with the name of location she would like referral sent.

## 2021-11-19 NOTE — Telephone Encounter (Signed)
Patient called back and she would like referral to be faxed to Dr.Mann office. Referral faxed they will call to schedule.

## 2021-11-20 NOTE — Telephone Encounter (Signed)
Patient called. She was informed Dr. Collene Mares reviewed her records and is not able to see her.  She would like referral to [facility].  Referral placed in Epic.

## 2021-11-20 NOTE — Telephone Encounter (Signed)
Dr. Lorie Apley office called in voice mail and left message that after reviewing patient's records Dr. Collene Mares is not able to see this patient.  I called Dr. Lorie Apley office back to find out the reason but was told "they do not tell us the reason, just that they cannot see the patient".  I called patient and left message to call to see if she has another preference for GI or I can place with [facility].

## 2021-12-03 NOTE — Telephone Encounter (Signed)
Patient called stating she has not heard from [facility] regarding referral. I provided patient with # [phone] to call and schedule.

## 2021-12-24 ENCOUNTER — Encounter: Payer: Self-pay | Admitting: Gastroenterology

## 2021-12-27 ENCOUNTER — Other Ambulatory Visit: Payer: Self-pay | Admitting: Obstetrics & Gynecology

## 2021-12-27 DIAGNOSIS — Z1231 Encounter for screening mammogram for malignant neoplasm of breast: Secondary | ICD-10-CM

## 2022-01-10 ENCOUNTER — Ambulatory Visit (AMBULATORY_SURGERY_CENTER): Payer: Self-pay

## 2022-01-10 VITALS — Ht 62.75 in | Wt 264.0 lb

## 2022-01-10 DIAGNOSIS — Z1211 Encounter for screening for malignant neoplasm of colon: Secondary | ICD-10-CM

## 2022-01-10 MED ORDER — NA SULFATE-K SULFATE-MG SULF 17.5-3.13-1.6 GM/177ML PO SOLN: 1.0000 | ORAL | 0 refills | Status: DC

## 2022-01-10 MED ORDER — ONDANSETRON HCL 4 MG PO TABS: 4.0000 mg | ORAL_TABLET | ORAL | 0 refills | Status: DC

## 2022-01-10 NOTE — Progress Notes (Signed)
No egg or soy allergy known to patient  No issues known to pt with past sedation with any surgeries or procedures Patient denies ever being told they had issues or difficulty with intubation  No FH of Malignant Hyperthermia Pt is not on diet pills Pt is not on  home 02  Pt is not on blood thinners  Pt denies issues with constipation  No A fib or A flutter Have any cardiac testing pending--denied Pt instructed to use [email or web address] or GoodRx for a price reduction on prep   

## 2022-01-22 ENCOUNTER — Ambulatory Visit
Admission: RE | Admit: 2022-01-22 | Discharge: 2022-01-22 | Disposition: A | Discharge status: Home / Self Care | Payer: BC Managed Care – PPO | Source: Ambulatory Visit | Attending: Obstetrics & Gynecology | Admitting: Obstetrics & Gynecology

## 2022-01-22 ENCOUNTER — Encounter: Payer: Self-pay | Admitting: Gastroenterology

## 2022-01-22 DIAGNOSIS — Z1231 Encounter for screening mammogram for malignant neoplasm of breast: Secondary | ICD-10-CM

## 2022-01-30 ENCOUNTER — Ambulatory Visit (AMBULATORY_SURGERY_CENTER): Payer: BC Managed Care – PPO | Admitting: Gastroenterology

## 2022-01-30 ENCOUNTER — Encounter: Payer: Self-pay | Admitting: Gastroenterology

## 2022-01-30 VITALS — BP 100/56 | HR 55 | Temp 98.0°F | Resp 20 | Ht 62.75 in | Wt 264.0 lb

## 2022-01-30 DIAGNOSIS — Z1211 Encounter for screening for malignant neoplasm of colon: Secondary | ICD-10-CM

## 2022-01-30 MED ORDER — SODIUM CHLORIDE 0.9 % IV SOLN: 500.0000 mL | INTRAVENOUS | Status: DC

## 2022-01-30 NOTE — Op Note (Signed)
[facility] Patient Name: Stacy Snyder Procedure Date: 01/30/2022 8:15 AM MRN: [id number] Endoscopist: Remo Lipps P. Havery Moros , MD Age: 51 Referring MD:  Date of Birth: 08/02/1970 Gender: Female Account #: [id number] Procedure:                Colonoscopy Indications:              Screening for colorectal malignant neoplasm, This                            is the patient's first colonoscopy Medicines:                Monitored Anesthesia Care Procedure:                Pre-Anesthesia Assessment:                           - Prior to the procedure, a History and Physical                            was performed, and patient medications and                            allergies were reviewed. The patient's tolerance of                            previous anesthesia was also reviewed. The risks                            and benefits of the procedure and the sedation                            options and risks were discussed with the patient.                            All questions were answered, and informed consent                            was obtained. Prior Anticoagulants: The patient has                            taken no previous anticoagulant or antiplatelet                            agents. ASA Grade Assessment: III - A patient with                            severe systemic disease. After reviewing the risks                            and benefits, the patient was deemed in                            satisfactory condition to undergo the procedure.  After obtaining informed consent, the colonoscope                            was passed under direct vision. Throughout the                            procedure, the patient's blood pressure, pulse, and                            oxygen saturations were monitored continuously. The                            CF [id number] #[id number] was introduced through the anus                            and advanced to  the the cecum, identified by                            appendiceal orifice and ileocecal valve. The                            colonoscopy was performed without difficulty. The                            patient tolerated the procedure well. The quality                            of the bowel preparation was adequate. The                            ileocecal valve, appendiceal orifice, and rectum                            were photographed. Scope In: 8:19:58 AM Scope Out: 8:33:46 AM Scope Withdrawal Time: 0 hours 10 minutes 41 seconds  Total Procedure Duration: 0 hours 13 minutes 48 seconds  Findings:                 The perianal and digital rectal examinations were                            normal.                           Internal hemorrhoids were found during                            retroflexion. The hemorrhoids were small.                           The exam was otherwise without abnormality. No                            polyps Complications:            No immediate complications. Estimated blood loss:  None. Estimated Blood Loss:     Estimated blood loss: none. Impression:               - Internal hemorrhoids.                           - The examination was otherwise normal.                           - No polyps Recommendation:           - Patient has a contact number available for                            emergencies. The signs and symptoms of potential                            delayed complications were discussed with the                            patient. Return to normal activities tomorrow.                            Written discharge instructions were provided to the                            patient.                           - Resume previous diet.                           - Continue present medications.                           - Repeat colonoscopy in 10 years for screening                            purposes. Remo Lipps P.  Havery Moros, MD 01/30/2022 8:37:57 AM This report has been signed electronically.

## 2022-01-30 NOTE — Progress Notes (Signed)
[facility] History and Physical   Primary Care Physician:  Leonides Sake, MD   Reason for Procedure:   CRC screening    Plan:    colonoscopy     HPI: Stacy Snyder is a 51 y.o. female  here for colonoscopy screening - first time exam. Patient denies any bowel symptoms at this time. No family history of colon cancer known. Otherwise feels well without any cardiopulmonary symptoms.   I have discussed risks / benefits of anesthesia and endoscopic procedure with Sharen Heck and they wish to proceed with the exams as outlined today.    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 Authorizing Provider  CALCIUM-VITAMIN D PO Take by mouth. Takes 2   Yes [provider]  cetirizine (ZYRTEC) 10 MG tablet Take 10 mg by mouth daily.   Yes [provider]  Cholecalciferol (VITAMIN D PO) Take by mouth. Takes 4   Yes [provider]  Cranberry 300 MG tablet Take 300 mg by mouth daily.   Yes [provider]  estradiol (ESTRACE) 0.5 MG tablet Take 1 tablet (0.5 mg total) by mouth daily. 10/16/21  Yes Princess Bruins, MD  Multiple Vitamin (MULTIVITAMIN) capsule Take 1 capsule by mouth daily.   Yes [provider]  OLIVE LEAF EXTRACT PO Take by mouth.   Yes [provider]  progesterone (PROMETRIUM) 100 MG capsule Take 1 capsule (100 mg total) by mouth at bedtime. 10/16/21  Yes Princess Bruins, MD  TURMERIC CURCUMIN PO Take by mouth.   Yes [provider]   valACYclovir (VALTREX) 500 MG tablet Sig: TAKE 1 TABLET BY MOUTH DAILY FOR PROPHYLAXIS 04/12/21  Yes Princess Bruins, MD  ondansetron (ZOFRAN) 4 MG tablet Take 1 tablet (4 mg total) by mouth as directed. Take 1 tab 30-60 minutes prior to each prep dose Patient not taking: Reported on 01/30/2022 01/10/22   Yetta Flock, MD  WEGOVY 0.25 MG/0.5ML Darden Palmer  12/06/21   [provider]    Current Outpatient Medications  Medication Sig Dispense Refill   CALCIUM-VITAMIN D PO Take by mouth. Takes 2     cetirizine (ZYRTEC) 10 MG tablet Take 10 mg by mouth daily.     Cholecalciferol (VITAMIN D PO) Take by mouth. Takes 4     Cranberry 300 MG tablet Take 300 mg by mouth daily.     estradiol (ESTRACE) 0.5 MG tablet Take 1 tablet (0.5 mg total) by mouth daily. 90 tablet 4   Multiple Vitamin (MULTIVITAMIN) capsule Take 1 capsule by mouth daily.     OLIVE LEAF EXTRACT PO Take by mouth.     progesterone (PROMETRIUM) 100 MG capsule Take 1 capsule (100 mg total) by mouth at bedtime. 90 capsule 4   TURMERIC CURCUMIN PO Take by mouth.     valACYclovir (VALTREX) 500 MG tablet Sig: TAKE 1 TABLET BY MOUTH DAILY FOR PROPHYLAXIS 90 tablet  4   ondansetron (ZOFRAN) 4 MG tablet Take 1 tablet (4 mg total) by mouth as directed. Take 1 tab 30-60 minutes prior to each prep dose (Patient not taking: Reported on 01/30/2022) 2 tablet 0   WEGOVY 0.25 MG/0.5ML SOAJ      Current Facility-Administered Medications  Medication Dose Route Frequency Provider Last Rate Last Admin   0.9 %  sodium chloride infusion  500 mL Intravenous Continuous Nikko Quast, Carlota Raspberry, MD        Allergies as of 01/30/2022 - Review Complete 01/30/2022  Allergen Reaction Noted   Septra [bactrim] Rash 11/20/2010   Sulfa antibiotics Rash     Family History  Problem Relation Age of Onset   Diabetes Maternal Grandmother    Hypertension Maternal Grandmother    Heart disease Maternal Grandmother    Diabetes Paternal Grandmother     Hypertension Paternal Grandmother    Heart disease Paternal Grandfather    Colon cancer Neg Hx    Colon polyps Neg Hx    Esophageal cancer Neg Hx    Rectal cancer Neg Hx    Stomach cancer Neg Hx     Social History   Socioeconomic History   Marital status: Single    Spouse name: Not on file   Number of children: Not on file   Years of education: Not on file   Highest education level: Not on file  Occupational History   Not on file  Tobacco Use   Smoking status: Never   Smokeless tobacco: Never  Vaping Use   Vaping Use: Never used  Substance and Sexual Activity   Alcohol use: Yes    Comment: occ   Drug use: No   Sexual activity: Not Currently    Birth control/protection: Post-menopausal, Surgical    Comment: BTL-1st intercourse 51 yo-Fewer than 5 partners  Other Topics Concern   Not on file  Social History Narrative   Not on file   Social Determinants of Health   Financial Resource Strain: Not on file  Food Insecurity: Not on file  Transportation Needs: Not on file  Physical Activity: Not on file  Stress: Not on file  Social Connections: Not on file  Intimate Partner Violence: Not on file    Review of Systems: All other review of systems negative except as mentioned in the HPI.  Physical Exam: Vital signs BP (!) 114/56   Pulse 60   Temp 98 F (36.7 C)   Ht 5' 2.75" (1.594 m)   Wt 264 lb (119.7 kg)   LMP 05/10/2009 Comment: BTL  SpO2 99%   BMI 47.14 kg/m   General:   Alert,  Well-developed, pleasant and cooperative in NAD Lungs:  Clear throughout to auscultation.   Heart:  Regular rate and rhythm Abdomen:  Soft, nontender and nondistended.   Neuro/Psych:  Alert and cooperative. Normal mood and affect. A and O x 3  Jolly Mango, MD [facility]

## 2022-01-30 NOTE — Patient Instructions (Signed)
Hand out on hemorrhoids given to patient.  Resume previous diet and continue present medications. Repeat colonoscopy in 10 years for surveillance!  YOU HAD AN ENDOSCOPIC PROCEDURE TODAY AT [facility]:   Refer to the procedure report that was given to you for any specific questions about what was found during the examination.  If the procedure report does not answer your questions, please call your gastroenterologist to clarify.  If you requested that your care partner not be given the details of your procedure findings, then the procedure report has been included in a sealed envelope for you to review at your convenience later.  YOU SHOULD EXPECT: Some feelings of bloating in the abdomen. Passage of more gas than usual.  Walking can help get rid of the air that was put into your GI tract during the procedure and reduce the bloating. If you had a lower endoscopy (such as a colonoscopy or flexible sigmoidoscopy) you may notice spotting of blood in your stool or on the toilet paper. If you underwent a bowel prep for your procedure, you may not have a normal bowel movement for a few days.  Please Note:  You might notice some irritation and congestion in your nose or some drainage.  This is from the oxygen used during your procedure.  There is no need for concern and it should clear up in a day or so.  SYMPTOMS TO REPORT IMMEDIATELY:  Following lower endoscopy (colonoscopy or flexible sigmoidoscopy):  Excessive amounts of blood in the stool  Significant tenderness or worsening of abdominal pains  Swelling of the abdomen that is new, acute  Fever of 100F or higher  For urgent or emergent issues, a gastroenterologist can be reached at any hour by calling [phone]. Do not use MyChart messaging for urgent concerns.    DIET:  We do recommend a small meal at first, but then you may proceed to your regular diet.  Drink plenty of fluids but you should avoid alcoholic beverages for  24 hours.  ACTIVITY:  You should plan to take it easy for the rest of today and you should NOT DRIVE or use heavy machinery until tomorrow (because of the sedation medicines used during the test).    FOLLOW UP: Our staff will call the number listed on your records the next business day following your procedure.  We will call around 7:15- 8:00 am to check on you and address any questions or concerns that you may have regarding the information given to you following your procedure. If we do not reach you, we will leave a message.  If you develop any symptoms (ie: fever, flu-like symptoms, shortness of breath, cough etc.) before then, please call [phone].  If you test positive for Covid 19 in the 2 weeks post procedure, please call and report this information to [place].    If any biopsies were taken you will be contacted by phone or by letter within the next 1-3 weeks.  Please call us at [phone] if you have not heard about the biopsies in 3 weeks.    SIGNATURES/CONFIDENTIALITY: You and/or your care partner have signed paperwork which will be entered into your electronic medical record.  These signatures attest to the fact that that the information above on your After Visit Summary has been reviewed and is understood.  Full responsibility of the confidentiality of this discharge information lies with you and/or your care-partner.

## 2022-01-30 NOTE — Progress Notes (Signed)
Sedate, gd SR, tolerated procedure well, VSS, report to RN 

## 2022-01-31 ENCOUNTER — Telehealth: Payer: Self-pay

## 2022-01-31 NOTE — Telephone Encounter (Signed)
  Follow up Call-     01/30/2022    7:48 AM  Call back number  Post procedure Call Back phone  # [phone]  Permission to leave phone message Yes     Patient questions:  Do you have a fever, pain , or abdominal swelling? No. Pain Score  0 *  Have you tolerated food without any problems? Yes.    Have you been able to return to your normal activities? Yes.    Do you have any questions about your discharge instructions: Diet   No. Medications  No. Follow up visit  No.  Do you have questions or concerns about your Care? No.  Actions: * If pain score is 4 or above: No action needed, pain <4.

## 2022-04-13 ENCOUNTER — Other Ambulatory Visit: Payer: Self-pay | Admitting: Obstetrics & Gynecology

## 2022-04-15 NOTE — Telephone Encounter (Signed)
Med refill request: Valacyclovir '500mg'$  tab PO daily.  Last AEX: 10/16/21 / ML Next AEX:Not scheduled  Last MMG (if hormonal med) N/A Refill authorized:     Requested Rx is different from RX last sent on 04/12/21. Please review and advise on refill.

## 2022-05-12 ENCOUNTER — Other Ambulatory Visit: Payer: Self-pay | Admitting: Obstetrics & Gynecology

## 2022-05-13 NOTE — Telephone Encounter (Signed)
Med refill request:Valtrex 500 mg tab po daily Hx HSV 1 Last AEX: 10/16/21 -ML Next AEX:Not scheduled    Rx pended for #90/1 RF. Due for AEX 10/2022  Refill authorized: Please Advise?

## 2022-06-06 ENCOUNTER — Ambulatory Visit: Payer: BC Managed Care – PPO | Admitting: Obstetrics & Gynecology

## 2022-06-24 ENCOUNTER — Encounter: Payer: Self-pay | Admitting: Obstetrics & Gynecology

## 2022-06-24 ENCOUNTER — Other Ambulatory Visit (HOSPITAL_COMMUNITY)
Admission: RE | Admit: 2022-06-24 | Discharge: 2022-06-24 | Disposition: A | Discharge status: Home / Self Care | Payer: BC Managed Care – PPO | Source: Ambulatory Visit | Attending: Obstetrics & Gynecology | Admitting: Obstetrics & Gynecology

## 2022-06-24 ENCOUNTER — Ambulatory Visit: Payer: BC Managed Care – PPO | Admitting: Obstetrics & Gynecology

## 2022-06-24 VITALS — BP 118/80

## 2022-06-24 DIAGNOSIS — N870 Mild cervical dysplasia: Secondary | ICD-10-CM

## 2022-06-24 NOTE — Progress Notes (Signed)
    ANALICIA SKIBINSKI 07/06/1970 [id number]        52 y.o.  G2P2L2  Inherited the house of her grand-mother in [place], remodeling.  RP: Repeat Pap test for Mild Dysplasia on Colpo on 11/07/21  HPI: Mild dysplasia (CIN 1) on Colpo 11/07/2021.  Pap LGSIL on 10/16/21 and HPV HR Neg 11/07/2021.   OB History  Gravida Para Term Preterm AB Living  '2 2       2  '$ SAB IAB Ectopic Multiple Live Births               # Outcome Date GA Lbr Len/2nd Weight Sex Delivery Anes PTL Lv  2 Para           1 Para             Past medical history,surgical history, problem list, medications, allergies, family history and social history were all reviewed and documented in the EPIC chart.   Directed ROS with pertinent positives and negatives documented in the history of present illness/assessment and plan.  Exam:  Vitals:   06/24/22 1339  BP: 118/80   General appearance:  Normal  Gynecologic exam: Vulva normal. Speculum:  Cervix/Vagina normal.  Pap reflex done.  No blood, no discharge.   Assessment/Plan:  52 y.o. G2P2   1. 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  Pap LGSIL on 10/16/21 and HPV HR Neg 11/07/2021.  Pap reflex done.  Management per results. - Cytology - PAP( [place])   Princess Bruins MD, 1:50 PM 06/24/2022

## 2022-06-27 LAB — CYTOLOGY - PAP
Comment: NEGATIVE
Diagnosis: UNDETERMINED — AB
High risk HPV: NEGATIVE

## 2022-11-03 ENCOUNTER — Other Ambulatory Visit: Payer: Self-pay | Admitting: Obstetrics & Gynecology

## 2022-11-03 DIAGNOSIS — N951 Menopausal and female climacteric states: Secondary | ICD-10-CM

## 2022-11-05 ENCOUNTER — Ambulatory Visit (INDEPENDENT_AMBULATORY_CARE_PROVIDER_SITE_OTHER): Payer: BC Managed Care – PPO | Admitting: Radiology

## 2022-11-05 ENCOUNTER — Encounter: Payer: Self-pay | Admitting: Radiology

## 2022-11-05 ENCOUNTER — Other Ambulatory Visit (HOSPITAL_COMMUNITY)
Admission: RE | Admit: 2022-11-05 | Discharge: 2022-11-05 | Disposition: A | Discharge status: Home / Self Care | Payer: BC Managed Care – PPO | Source: Ambulatory Visit | Attending: Radiology | Admitting: Radiology

## 2022-11-05 VITALS — BP 118/76 | Ht 62.5 in | Wt 265.0 lb

## 2022-11-05 DIAGNOSIS — B009 Herpesviral infection, unspecified: Secondary | ICD-10-CM

## 2022-11-05 DIAGNOSIS — N951 Menopausal and female climacteric states: Secondary | ICD-10-CM | POA: Diagnosis not present

## 2022-11-05 DIAGNOSIS — Z01419 Encounter for gynecological examination (general) (routine) without abnormal findings: Secondary | ICD-10-CM | POA: Insufficient documentation

## 2022-11-05 MED ORDER — ESTRADIOL 0.5 MG PO TABS: 0.5000 mg | ORAL_TABLET | Freq: Every day | ORAL | 4 refills | Status: DC

## 2022-11-05 MED ORDER — VALACYCLOVIR HCL 500 MG PO TABS: ORAL_TABLET | ORAL | 4 refills | Status: DC

## 2022-11-05 MED ORDER — PROGESTERONE MICRONIZED 100 MG PO CAPS: 100.0000 mg | ORAL_CAPSULE | Freq: Every day | ORAL | 4 refills | Status: DC

## 2022-11-05 NOTE — Progress Notes (Signed)
   Stacy Snyder 05-May-1971 [id number]   History: Postmenopausal 52 y.o. presents for annual exam. No new concerns. Doing well on HRT and valtrex prophylaxis. AP [employer] school.   Gynecologic History Postmenopausal Last Pap: 06/2022. Results were: abnormal Last mammogram: 01/2022. Results were: normal Last colonoscopy: 2023, normal DEXA:never HRT use: current, doing well  Obstetric History OB History  Gravida Para Term Preterm AB Living  2 2       2   SAB IAB Ectopic Multiple Live Births               # Outcome Date GA Lbr Len/2nd Weight Sex Delivery Anes PTL Lv  2 Para           1 Para              The following portions of the patient's history were reviewed and updated as appropriate: allergies, current medications, past family history, past medical history, past social history, past surgical history, and problem list.  Review of Systems Pertinent items noted in HPI and remainder of comprehensive ROS otherwise negative.  Past medical history, past surgical history, family history and social history were all reviewed and documented in the EPIC chart.  Exam:  Vitals:   11/05/22 0758  BP: 118/76  Weight: 265 lb (120.2 kg)  Height: 5' 2.5" (1.588 m)   Body mass index is 47.7 kg/m.  General appearance:  Normal, obese Thyroid:  Symmetrical, normal in size, without palpable masses or nodularity. Respiratory  Auscultation:  Clear without wheezing or rhonchi Cardiovascular  Auscultation:  Regular rate, without rubs, murmurs or gallops  Edema/varicosities:  Not grossly evident Abdominal  Soft,nontender, without masses, guarding or rebound.  Liver/spleen:  No organomegaly noted  Hernia:  None appreciated  Skin  Inspection:  Grossly normal Breasts: Examined lying and sitting.   Right: Without masses, retractions, nipple discharge or axillary adenopathy.   Left: Without masses, retractions, nipple discharge or axillary adenopathy. Genitourinary   Inguinal/mons:  Normal  without inguinal adenopathy  External genitalia:  Normal appearing vulva with no masses, tenderness, or lesions  BUS/Urethra/Skene's glands:  Normal  Vagina:  Normal appearing with normal color and discharge, no lesions.   Cervix:  Normal appearing without discharge or lesions  Uterus:  Normal in size, shape and contour.  Midline and mobile, nontender  Adnexa/parametria:     Rt: Normal in size, without masses or tenderness.   Lt: Normal in size, without masses or tenderness.  Anus and perineum: Normal    Raynelle Fanning, CMA present for exam  Assessment/Plan:   1. Well woman exam with routine gynecological exam 06/24/22 ASCUS HPV neg Colpo 10/2021 CIN 1 - Cytology - PAP( [place]) -Mammogram due 01/2023  2. HSV infection Continue daily prophylaxis - valACYclovir (VALTREX) 500 MG tablet; TAKE 1 TABLET BY MOUTH DAILY FOR PROPHYLAXIS  Dispense: 90 tablet; Refill: 4  3. Menopause syndrome - estradiol (ESTRACE) 0.5 MG tablet; Take 1 tablet (0.5 mg total) by mouth daily.  Dispense: 90 tablet; Refill: 4 - progesterone (PROMETRIUM) 100 MG capsule; Take 1 capsule (100 mg total) by mouth at bedtime.  Dispense: 90 capsule; Refill: 4    Discussed SBE, colonoscopy(10 years) and DEXA screening as directed. Recommend of exercise weekly, including weight bearing exercise. Encouraged the use of seatbelts and sunscreen.  Return in 1 year for annual or sooner prn.  Arlie Solomons B WHNP-BC, 8:59 AM 11/05/2022

## 2022-11-06 NOTE — Telephone Encounter (Signed)
Med refill request: Progesterone 100mg , duplicate rf sent 11/05/22 #90 with 4 refills by Jami. Last AEX: 11/05/22 RF denied.  Duplicate.

## 2022-11-08 LAB — CYTOLOGY - PAP
Comment: NEGATIVE
Diagnosis: UNDETERMINED — AB
High risk HPV: NEGATIVE

## 2022-11-25 ENCOUNTER — Telehealth: Payer: Self-pay

## 2022-11-25 DIAGNOSIS — Z1329 Encounter for screening for other suspected endocrine disorder: Secondary | ICD-10-CM

## 2022-11-25 DIAGNOSIS — Z131 Encounter for screening for diabetes mellitus: Secondary | ICD-10-CM

## 2022-11-25 DIAGNOSIS — Z130 Encounter for screening for diseases of the blood and blood-forming organs and certain disorders involving the immune mechanism: Secondary | ICD-10-CM

## 2022-11-25 DIAGNOSIS — Z13220 Encounter for screening for lipoid disorders: Secondary | ICD-10-CM

## 2022-11-25 DIAGNOSIS — Z1321 Encounter for screening for nutritional disorder: Secondary | ICD-10-CM

## 2022-11-25 DIAGNOSIS — Z13228 Encounter for screening for other metabolic disorders: Secondary | ICD-10-CM

## 2022-11-25 NOTE — Telephone Encounter (Signed)
LDVM on machine per DPR for pt to cb and schedule fasting lab appt at her earliest convenience. Will place lab orders. Routed to provider for final review and encounter closed.

## 2022-11-25 NOTE — Telephone Encounter (Signed)
Ok to schedule but PCP will need to manage any abnormals

## 2022-11-25 NOTE — Telephone Encounter (Signed)
Pt calling to request to have lab panel performed.   Desires to have panels done that have been done in the past since she saw Dr. Audie Box. She is aware that not all providers do this regularly and she should request if desired and if treatment recommended d/t abnormal levels she may be referred to PCP for mgmt. Pt voiced understanding.  Pt would like CBC, CMP, FLP, TFT, Vit D, and HGBA1C done if possible.   Please advise.  AEX performed on 11/05/22.

## 2022-11-27 ENCOUNTER — Other Ambulatory Visit: Payer: BC Managed Care – PPO

## 2022-11-27 DIAGNOSIS — Z131 Encounter for screening for diabetes mellitus: Secondary | ICD-10-CM

## 2022-11-27 DIAGNOSIS — Z13228 Encounter for screening for other metabolic disorders: Secondary | ICD-10-CM

## 2022-11-27 DIAGNOSIS — Z130 Encounter for screening for diseases of the blood and blood-forming organs and certain disorders involving the immune mechanism: Secondary | ICD-10-CM

## 2022-11-27 DIAGNOSIS — Z1321 Encounter for screening for nutritional disorder: Secondary | ICD-10-CM

## 2022-11-27 DIAGNOSIS — Z13220 Encounter for screening for lipoid disorders: Secondary | ICD-10-CM

## 2022-11-27 DIAGNOSIS — Z1329 Encounter for screening for other suspected endocrine disorder: Secondary | ICD-10-CM

## 2022-11-27 LAB — CBC
HCT: 35.7 % (ref 35.0–45.0)
MCH: 26.4 pg — ABNORMAL LOW (ref 27.0–33.0)
MPV: 10.2 fL (ref 7.5–12.5)
RBC: 4.36 10*6/uL (ref 3.80–5.10)
RDW: 13.7 % (ref 11.0–15.0)
WBC: 6.7 10*3/uL (ref 3.8–10.8)

## 2022-11-27 LAB — LIPID PANEL
HDL: 61 mg/dL (ref >=50)
Non-HDL Cholesterol (Calc): 151 mg/dL (calc) — ABNORMAL HIGH (ref <130)

## 2022-11-27 LAB — COMPREHENSIVE METABOLIC PANEL
AG Ratio: 1.5 (calc) (ref 1.0–2.5)
ALT: 14 U/L (ref 6–29)
Alkaline phosphatase (APISO): 111 U/L (ref 37–153)
BUN: 11 mg/dL (ref 7–25)
Glucose, Bld: 94 mg/dL (ref 65–99)
Total Protein: 6.7 g/dL (ref 6.1–8.1)

## 2022-11-28 LAB — COMPREHENSIVE METABOLIC PANEL
AST: 18 U/L (ref 10–35)
Albumin: 4 g/dL (ref 3.6–5.1)
CO2: 29 mmol/L (ref 20–32)
Calcium: 9.3 mg/dL (ref 8.6–10.4)
Chloride: 103 mmol/L (ref 98–110)
Creat: 0.73 mg/dL (ref 0.50–1.03)
Globulin: 2.7 g/dL (calc) (ref 1.9–3.7)
Potassium: 4.2 mmol/L (ref 3.5–5.3)
Sodium: 139 mmol/L (ref 135–146)
Total Bilirubin: 0.4 mg/dL (ref 0.2–1.2)

## 2022-11-28 LAB — CBC
Hemoglobin: 11.5 g/dL — ABNORMAL LOW (ref 11.7–15.5)
MCHC: 32.2 g/dL (ref 32.0–36.0)
MCV: 81.9 fL (ref 80.0–100.0)
Platelets: 390 10*3/uL (ref 140–400)

## 2022-11-28 LAB — LIPID PANEL
Cholesterol: 212 mg/dL — ABNORMAL HIGH (ref <200)
LDL Cholesterol (Calc): 129 mg/dL (calc) — ABNORMAL HIGH
Total CHOL/HDL Ratio: 3.5 (calc) (ref <5.0)
Triglycerides: 111 mg/dL (ref <150)

## 2022-11-28 LAB — TSH: TSH: 2.84 mIU/L

## 2022-11-28 LAB — HEMOGLOBIN A1C W/OUT EAG: Hgb A1c MFr Bld: 6 % of total Hgb — ABNORMAL HIGH (ref <5.7)

## 2022-11-28 LAB — VITAMIN D 25 HYDROXY (VIT D DEFICIENCY, FRACTURES): Vit D, 25-Hydroxy: 53 ng/mL (ref 30–100)

## 2022-12-09 NOTE — Progress Notes (Signed)
Elevated A1C of 6.0 please see PCP for management

## 2022-12-26 ENCOUNTER — Ambulatory Visit: Payer: BC Managed Care – PPO | Admitting: Radiology

## 2023-02-17 ENCOUNTER — Other Ambulatory Visit: Payer: Self-pay | Admitting: Radiology

## 2023-02-17 DIAGNOSIS — Z1231 Encounter for screening mammogram for malignant neoplasm of breast: Secondary | ICD-10-CM

## 2023-03-11 ENCOUNTER — Ambulatory Visit
Admission: RE | Admit: 2023-03-11 | Discharge: 2023-03-11 | Disposition: A | Discharge status: Home / Self Care | Payer: BC Managed Care – PPO | Source: Ambulatory Visit | Attending: Radiology | Admitting: Radiology

## 2023-03-11 DIAGNOSIS — Z1231 Encounter for screening mammogram for malignant neoplasm of breast: Secondary | ICD-10-CM

## 2023-06-09 ENCOUNTER — Ambulatory Visit: Payer: BC Managed Care – PPO | Admitting: Radiology

## 2023-06-09 ENCOUNTER — Other Ambulatory Visit (HOSPITAL_COMMUNITY)
Admission: RE | Admit: 2023-06-09 | Discharge: 2023-06-09 | Disposition: A | Discharge status: Home / Self Care | Payer: BC Managed Care – PPO | Source: Ambulatory Visit | Attending: Radiology | Admitting: Radiology

## 2023-06-09 VITALS — BP 122/76 | HR 63

## 2023-06-09 DIAGNOSIS — N870 Mild cervical dysplasia: Secondary | ICD-10-CM | POA: Diagnosis not present

## 2023-06-09 DIAGNOSIS — R87810 Cervical high risk human papillomavirus (HPV) DNA test positive: Secondary | ICD-10-CM | POA: Insufficient documentation

## 2023-06-09 DIAGNOSIS — N760 Acute vaginitis: Secondary | ICD-10-CM | POA: Diagnosis not present

## 2023-06-09 DIAGNOSIS — N951 Menopausal and female climacteric states: Secondary | ICD-10-CM | POA: Diagnosis not present

## 2023-06-09 DIAGNOSIS — R300 Dysuria: Secondary | ICD-10-CM

## 2023-06-09 DIAGNOSIS — B9689 Other specified bacterial agents as the cause of diseases classified elsewhere: Secondary | ICD-10-CM | POA: Insufficient documentation

## 2023-06-09 MED ORDER — ESTRADIOL 0.05 MG/24HR TD PTTW
1.0000 | MEDICATED_PATCH | TRANSDERMAL | 3 refills | Status: DC
Start: 2023-06-09 — End: 2023-08-04

## 2023-06-09 MED ORDER — PROGESTERONE MICRONIZED 100 MG PO CAPS
100.0000 mg | ORAL_CAPSULE | Freq: Every day | ORAL | 3 refills | Status: DC
Start: 2023-06-09 — End: 2023-08-04

## 2023-06-09 MED ORDER — ESTRADIOL 0.1 MG/GM VA CREA
1.0000 g | TOPICAL_CREAM | VAGINAL | 12 refills | Status: DC
Start: 2023-06-09 — End: 2023-08-04

## 2023-06-09 NOTE — Progress Notes (Signed)
      Subjective: Stacy Snyder is a 52 y.o. female Repeat pap--patient request P 11/05/22 ASCUS, HPV negative P 06/24/22 ASCUS, HPV negative Colpo 11/07/21 CIN 1. Complains of ? UTI, dysuria, frequency, no urgency. Symptoms began 2 days ago. Also c/o vaginal dryness and dyspareunia. Was told to stop her HRT by PCP, wants to restart.    Review of Systems  All other systems reviewed and are negative.   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   Physical Exam Vitals and nursing note reviewed. Exam conducted with a chaperone present.  Constitutional:      Appearance: Normal appearance. She is well-developed.  Pulmonary:     Effort: Pulmonary effort is normal.  Abdominal:     General: Abdomen is flat.     Palpations: Abdomen is soft.  Genitourinary:    General: Normal vulva.     Vagina: Normal. No erythema, bleeding or lesions.     Cervix: Normal. No discharge, friability, lesion or erythema.     Uterus: Normal.      Adnexa: Right adnexa normal and left adnexa normal.  Neurological:     Mental Status: She is alert.  Psychiatric:        Mood and Affect: Mood normal.        Thought Content: Thought content normal.        Judgment: Judgment normal.    Raynelle Fanning, CMA present for exam  Urine dipstick shows positive for leukocytes.  Micro exam: 10-20 WBC's per HPF.   Assessment:/Plan:  1. Dysuria (Primary) - Urinalysis,Complete w/RFL Culture  2. Dysplasia of cervix, low grade (CIN 1) - Cytology - PAP( [place])    3. Menopause syndrome - progesterone (PROMETRIUM)  100 MG capsule; Take 1 capsule (100 mg total) by mouth at bedtime.  Dispense: 90 capsule; Refill: 3 - estradiol (ESTRACE VAGINAL) 0.1 MG/GM vaginal cream; Place 1 g vaginally 3 (three) times a week.  Dispense: 42.5 g; Refill: 12 - estradiol (VIVELLE-DOT) 0.05 MG/24HR patch; Place 1 patch (0.05 mg total) onto the skin 2 (two) times a week.  Dispense: 24 patch; Refill: 3    Beacher Every B, NP 9:28 AM

## 2023-06-11 LAB — URINE CULTURE
MICRO NUMBER:: 15899735
Result:: NO GROWTH
SPECIMEN QUALITY:: ADEQUATE

## 2023-06-11 LAB — URINALYSIS, COMPLETE W/RFL CULTURE
Bilirubin Urine: NEGATIVE
Casts: NONE SEEN /LPF
Crystals: NONE SEEN /HPF
Glucose, UA: NEGATIVE
Hgb urine dipstick: NEGATIVE
Ketones, ur: NEGATIVE
Nitrites, Initial: NEGATIVE
Protein, ur: NEGATIVE
RBC / HPF: NONE SEEN /HPF (ref 0–2)
Specific Gravity, Urine: 1.025 (ref 1.001–1.035)
Yeast: NONE SEEN /HPF
pH: 5.5 (ref 5.0–8.0)

## 2023-06-11 LAB — CULTURE INDICATED

## 2023-06-12 ENCOUNTER — Other Ambulatory Visit: Payer: Self-pay

## 2023-06-12 DIAGNOSIS — N760 Acute vaginitis: Secondary | ICD-10-CM

## 2023-06-12 LAB — CYTOLOGY - PAP
Adequacy: ABSENT
Comment: NEGATIVE
Comment: NEGATIVE
Comment: NEGATIVE
Diagnosis: NEGATIVE
HPV 16: NEGATIVE
HPV 18 / 45: NEGATIVE
High risk HPV: POSITIVE — AB

## 2023-06-12 MED ORDER — METRONIDAZOLE 500 MG PO TABS
500.0000 mg | ORAL_TABLET | Freq: Two times a day (BID) | ORAL | 0 refills | Status: AC
Start: 2023-06-12 — End: 2023-06-19

## 2023-07-01 ENCOUNTER — Telehealth: Payer: Self-pay | Admitting: *Deleted

## 2023-07-01 NOTE — Telephone Encounter (Signed)
Spoke with patient, advised per Jami. Patient has enough of current RX, will call with update and request refill if needed at that time.   Patient verbalizes understanding and is agreeable.   Encounter closed.

## 2023-07-01 NOTE — Telephone Encounter (Signed)
Increase progesterone to 200mg  for the next month.

## 2023-07-01 NOTE — Telephone Encounter (Signed)
Spoke with patient. Patient started vaginal estrogen 06/09/23, 1 gram vaginally 3 times a week. Also on estradiol patch and progesterone.   Reports brown spotting/discharge on 1/20, followed by light flow and cramping this morning.   Denies any other GYN symptoms.   Patient states her schedule is limited for scheduling, would likely need to be next week. Advised Clearnce Hasten would be out of the office for the next 3 weeks. Advised I will review with Clearnce Hasten and return call with recommendations, patient agreeable.   Jami -please review and advise.

## 2023-07-18 ENCOUNTER — Telehealth: Payer: Self-pay

## 2023-07-18 NOTE — Telephone Encounter (Addendum)
 Patient states she has not had a menstrual cycle in 15 yrs. She had a menstrual cycle that started on 07-01-23 with regular flow that lasted for 7 days. She notified the office on 07-01-23 & her progesterone  was increased to 200mg  that she takes every night. Cycle came again on 07-17-23 & having cramping, no dizziness or nausea. Patient uses estrace  vaginal cream once a week & vivelle  dot patch 0.05mg  twice weekly. Patient was frustrated after getting bleeding again & took her patch off this morning. Patient states she does not want to take anything if she is going to have bleeding. Patient states she is going to stop taking all of her HRT. Patient scheduled an appt to see Jami on 08-08-23 at 8am. Pt was offered other appts but stated she wanted to wait to see [place] if she has to come in. Patient prefers to not come in for an appt due to the fact that she has a long drive. Routing message to provider inbox for review. Please advise.

## 2023-07-29 NOTE — Telephone Encounter (Signed)
Her alternative option is the drop the estradiol down to 0.025mg  and continue the 200mg  progesterone for now to see if that helps before coming in if she would prefer not to have to drive in.

## 2023-08-04 NOTE — Telephone Encounter (Signed)
 Spoke with patient. Patient states she has stopped estradiol vaginal cream, estradiol patch and Prometrium. Reports no bleeding. Is asking if OV on 2/28 still needed?   Advised keep visit as is for now, will send to [facility] for review when she returns on 2/25 and our office will f/u with recommendations. Can cancel at that time if not needed. Patient agreeable.   Jami -please review and advise.

## 2023-08-05 NOTE — Telephone Encounter (Signed)
 LDVM on machine per DPR advising the pt of JC's recommendations. Encounter closed.

## 2023-08-05 NOTE — Telephone Encounter (Signed)
 May cancel appt. I would recommend continuing the vaginal estrogen. That will no cause bleeding, it will actually prevent any bleeding from the vagina and help keep paps normal.

## 2023-08-08 ENCOUNTER — Ambulatory Visit: Payer: Self-pay | Admitting: Radiology

## 2023-12-25 ENCOUNTER — Ambulatory Visit (INDEPENDENT_AMBULATORY_CARE_PROVIDER_SITE_OTHER): Payer: Self-pay | Admitting: Radiology

## 2023-12-25 ENCOUNTER — Encounter: Payer: Self-pay | Admitting: Radiology

## 2023-12-25 VITALS — BP 124/82 | HR 82 | Ht 63.25 in | Wt 275.8 lb

## 2023-12-25 DIAGNOSIS — Z01419 Encounter for gynecological examination (general) (routine) without abnormal findings: Secondary | ICD-10-CM | POA: Diagnosis not present

## 2023-12-25 DIAGNOSIS — N870 Mild cervical dysplasia: Secondary | ICD-10-CM

## 2023-12-25 DIAGNOSIS — Z1331 Encounter for screening for depression: Secondary | ICD-10-CM

## 2023-12-25 MED ORDER — ESTRADIOL 0.1 MG/GM VA CREA: 1.0000 g | TOPICAL_CREAM | VAGINAL | 3 refills | Status: AC

## 2023-12-25 MED ORDER — WEGOVY 0.25 MG/0.5ML ~~LOC~~ SOAJ: 0.2500 mg | SUBCUTANEOUS | 0 refills | Status: DC

## 2023-12-25 MED ORDER — WEGOVY 1 MG/0.5ML ~~LOC~~ SOAJ: 1.0000 mg | SUBCUTANEOUS | 0 refills | Status: DC

## 2023-12-25 MED ORDER — WEGOVY 0.5 MG/0.5ML ~~LOC~~ SOAJ: 0.5000 mg | SUBCUTANEOUS | 0 refills | Status: DC

## 2023-12-25 NOTE — Progress Notes (Signed)
 Stacy Snyder [birth year] [id number]   History: Postmenopausal 53 y.o. presents for annual exam. Would like to start wegovy  for weight loss (self pay). Has not lost any weight with diet and exercise. Was also started on metformin by PCP with no weight loss since. 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   Gynecologic History Postmenopausal Last Pap: 06/09/23 normal, HPV positive P 11/05/22 ASCUS, HPV negative P 06/24/22 ASCUS, HPV negative P 10/16/21 LSIL  Last mammogram: 10/24. Results were: normal Last colonoscopy: 01/30/22   Obstetric History OB History  Gravida Para Term Preterm AB Living  2 2    2   SAB IAB Ectopic Multiple Live Births          # Outcome Date GA Lbr Len/2nd Weight Sex Type Anes PTL Lv  2 Para           1 Para                12/25/2023    8:16 AM  Depression screen PHQ 2/9  Decreased Interest 0  Down, Depressed, Hopeless 0  PHQ - 2 Score 0     The following portions of the patient's history were reviewed and updated as appropriate: allergies, current medications, past family history, past medical history, past social history, past surgical history, and problem list.  Review of Systems Pertinent items noted in HPI and remainder of comprehensive ROS otherwise negative.  Past medical history, past surgical history, family history and social history were all reviewed and documented in the EPIC chart.  Exam:  Vitals:   12/25/23 0814  BP: 124/82  Pulse: 82  SpO2: 97%  Weight: 275 lb 12.8 oz (125.1 kg)  Height: 5' 3.25 (1.607 m)   Body mass index is 48.47 kg/m.  General appearance:  Normal Thyroid:  Symmetrical, normal in size, without palpable masses or nodularity. Respiratory  Auscultation:  Clear without wheezing or rhonchi Cardiovascular  Auscultation:  Regular rate, without rubs, murmurs or gallops  Edema/varicosities:  Not grossly evident Abdominal  Soft,nontender, without masses, guarding or rebound.  Liver/spleen:  No  organomegaly noted  Hernia:  None appreciated  Skin  Inspection:  Grossly normal Breasts: Examined lying and sitting.   Right: Without masses, retractions, nipple discharge or axillary adenopathy.   Left: Without masses, retractions, nipple discharge or axillary adenopathy. Genitourinary   Inguinal/mons:  Normal without inguinal adenopathy  External genitalia:  Normal appearing vulva with no masses, tenderness, or lesions  BUS/Urethra/Skene's glands:  Normal  Vagina:  Normal appearing with normal color and discharge, no lesions. Atrophy: mild   Cervix:  Normal appearing without discharge or lesions  Uterus:  Normal in size, shape and contour.  Midline and mobile, nontender  Adnexa/parametria:     Rt: Normal in size, without masses or tenderness.   Lt: Normal in size, without masses or tenderness.  Anus and perineum: Normal    Darice Hoit, CMA present for exam  Assessment/Plan:   1. Well woman exam with routine gynecological exam (Primary) Pap due 12/205  2. Depression screen negative  3. Dysplasia of cervix, low grade (CIN 1) - estradiol  (ESTRACE ) 0.1 MG/GM vaginal cream; Place 1 g vaginally 3 (three) times a week.  Dispense: 42.5 g; Refill: 3  4. Morbid obesity (HCC) - Semaglutide -Weight Management (WEGOVY ) 1 MG/0.5ML SOAJ; Inject 1 mg into the skin once a week.  Dispense: 2 mL; Refill: 0   Follow up 3 months for weight check, 3 doses sent (self pay)  Return in  1 year for annual or sooner prn.  Kaiyon Hynes B WHNP-BC, 9:11 AM 12/25/2023

## 2024-01-02 ENCOUNTER — Telehealth: Payer: Self-pay

## 2024-01-02 ENCOUNTER — Other Ambulatory Visit: Payer: Self-pay | Admitting: Radiology

## 2024-01-02 DIAGNOSIS — B009 Herpesviral infection, unspecified: Secondary | ICD-10-CM

## 2024-01-02 NOTE — Telephone Encounter (Signed)
 Medication refill request: valtrex  500mg  Last AEX:  12-25-23 Next AEX: not scheduled Last MMG (if hormonal medication request): 03-11-23 birads 1:neg Refill authorized: please approve if appropriate

## 2024-01-02 NOTE — Telephone Encounter (Signed)
 Prior authorization request received from [facility] for Wegovy  0.25 mg.  Per note on RX, patient was supposed to pay cash and use discount coupon for $299.  I spoke to the patient and she said she went to the pharmacy today and originally was told RX would be $1500 with coupon.  Pharmacist ran coupon again and patient was able to get RX for $299.  Prior authorization is not needed.

## 2024-01-23 ENCOUNTER — Other Ambulatory Visit: Payer: Self-pay | Admitting: Radiology

## 2024-01-23 DIAGNOSIS — Z1231 Encounter for screening mammogram for malignant neoplasm of breast: Secondary | ICD-10-CM

## 2024-01-27 ENCOUNTER — Other Ambulatory Visit: Payer: Self-pay | Admitting: Radiology

## 2024-01-27 DIAGNOSIS — N951 Menopausal and female climacteric states: Secondary | ICD-10-CM

## 2024-01-27 NOTE — Telephone Encounter (Signed)
 Med refill request: Last AEX: 12/25/23 Next OV: 03/26/24 Next AEX: none scheduled Last MMG (if hormonal med) 03/11/23 BI-RADS 1 negative The original prescription was discontinued on 06/09/2023 by Chrzanowski, Jami B, NP for the following reason: Dose change. Renewing this prescription may not be appropriate.  Sent to provider for review.

## 2024-03-11 ENCOUNTER — Ambulatory Visit
Admission: RE | Admit: 2024-03-11 | Discharge: 2024-03-11 | Disposition: A | Discharge status: Home / Self Care | Payer: Self-pay | Source: Ambulatory Visit | Attending: Radiology | Admitting: Radiology

## 2024-03-11 DIAGNOSIS — Z1231 Encounter for screening mammogram for malignant neoplasm of breast: Secondary | ICD-10-CM

## 2024-03-25 NOTE — Progress Notes (Signed)
   Stacy Snyder 1970/09/05 [id number]   History:  53 y.o.  presents for weight management. Started wegovy  3 months ago. Has only lost 9lbs. Continues to focus on a healthy diet and exercise.   Gynecologic History Patient's last menstrual period was 05/10/2009. Contraception/Family planning: post menopausal status   Obstetric History OB History  Gravida Para Term Preterm AB Living  2 2    2   SAB IAB Ectopic Multiple Live Births          # Outcome Date GA Lbr Len/2nd Weight Sex Type Anes PTL Lv  2 Para           1 Para              Review of Systems  All other systems reviewed and are negative.   Past medical history, past surgical history, family history and social history were all reviewed and documented in the EPIC chart.  Exam:  Vitals:   03/26/24 1057  BP: 116/78  Pulse: 75  SpO2: 97%  Weight: 266 lb (120.7 kg)   Body mass index is 46.75 kg/m.  Physical Exam Constitutional:      Appearance: Normal appearance. She is obese.  Pulmonary:     Effort: Pulmonary effort is normal.  Neurological:     Mental Status: She is alert.  Psychiatric:        Mood and Affect: Mood normal.        Thought Content: Thought content normal.        Judgment: Judgment normal.      Starting weight:275 Current weight:266 First goal:225 Overall weight loss goal:190  Assessment/Plan:   1. Morbid obesity (HCC) (Primary) Will change to zepbound as patient is not losing as expected on Wegovy  - tirzepatide (ZEPBOUND) 10 MG/0.5ML injection vial; Inject 10 mg into the skin once a week.  Dispense: 2 mL; Refill: 0 - Tirzepatide-Weight Management (ZEPBOUND) 12.5 MG/0.5ML SOLN; Inject 12.5 mg into the skin once a week.  Dispense: 2 mL; Refill: 0 - Tirzepatide-Weight Management (ZEPBOUND) 15 MG/0.5ML SOLN; Inject 15 mg into the skin once a week.  Dispense: 2 mL; Refill: 1    Risks and benefits discussed. Continue to focus on protein and fiber. Small frequent meals. Adequate  hydration and electrolyte replacement. Will continue to exercise for at least a week including weight bearing exercise.   Return in about 3 months (around 06/26/2024) for Med Follow-up.  GINETTE COZIER B WHNP-BC 11:20 AM 03/26/2024

## 2024-03-26 ENCOUNTER — Ambulatory Visit: Admitting: Radiology

## 2024-03-26 ENCOUNTER — Encounter: Payer: Self-pay | Admitting: Radiology

## 2024-03-26 MED ORDER — ZEPBOUND 10 MG/0.5ML ~~LOC~~ SOLN: 10.0000 mg | SUBCUTANEOUS | 0 refills | Status: DC

## 2024-03-26 MED ORDER — ZEPBOUND 12.5 MG/0.5ML ~~LOC~~ SOLN: 12.5000 mg | SUBCUTANEOUS | 0 refills | Status: DC

## 2024-03-26 MED ORDER — ZEPBOUND 15 MG/0.5ML ~~LOC~~ SOLN: 15.0000 mg | SUBCUTANEOUS | 1 refills | Status: DC

## 2024-04-14 ENCOUNTER — Other Ambulatory Visit: Payer: Self-pay | Admitting: Radiology

## 2024-04-14 NOTE — Telephone Encounter (Signed)
 Received zepbound rx request, pharmacy has a rx on file with with fill date 04/23/24

## 2024-05-12 ENCOUNTER — Other Ambulatory Visit: Payer: Self-pay | Admitting: Radiology

## 2024-05-13 NOTE — Telephone Encounter (Signed)
 Med refill request: Zepbound 12.5 mg Last AEX: 12/25/23 Next AEX: none scheduled Next OV: 06/29/24 Last MMG (if hormonal med) n/a DX: E66.01 morbid obesity Sent to provider for approval or denial as appropriate.

## 2024-05-14 NOTE — Telephone Encounter (Signed)
 Looks like she will move up to 15 mg 12/12. Jami has already placed this order. Will refuse 12.5 mg request.

## 2024-06-29 ENCOUNTER — Encounter: Payer: Self-pay | Admitting: Radiology

## 2024-06-29 ENCOUNTER — Ambulatory Visit (INDEPENDENT_AMBULATORY_CARE_PROVIDER_SITE_OTHER): Admitting: Radiology

## 2024-06-29 MED ORDER — ZEPBOUND 15 MG/0.5ML ~~LOC~~ SOLN: 15.0000 mg | SUBCUTANEOUS | 3 refills | Status: AC

## 2024-06-29 NOTE — Progress Notes (Signed)
" ° °  ELNITA SURPRENANT May 22, 1971 [id number]   History:  54 y.o.  presents for weight management. Started wegovy  3 months ago. Has only lost 23bs. Continues to focus on a healthy diet and exercise.   Gynecologic History Patient's last menstrual period was 05/10/2009. Contraception/Family planning: post menopausal status   Obstetric History OB History  Gravida Para Term Preterm AB Living  2 2    2   SAB IAB Ectopic Multiple Live Births          # Outcome Date GA Lbr Len/2nd Weight Sex Type Anes PTL Lv  2 Para           1 Para              Review of Systems  All other systems reviewed and are negative.   Past medical history, past surgical history, family history and social history were all reviewed and documented in the EPIC chart.  Exam:  Vitals:   06/29/24 0928  BP: 124/82  Pulse: 77  SpO2: 99%  Weight: 252 lb (114.3 kg)   Body mass index is 44.29 kg/m.  Physical Exam Constitutional:      Appearance: Normal appearance. She is obese.  Pulmonary:     Effort: Pulmonary effort is normal.  Neurological:     Mental Status: She is alert.  Psychiatric:        Mood and Affect: Mood normal.        Thought Content: Thought content normal.        Judgment: Judgment normal.     Starting weight:275 Current weight:252 First goal:225 Overall weight loss goal:190  Assessment/Plan:   1. Morbid obesity (HCC) - Tirzepatide -Weight Management (ZEPBOUND ) 15 MG/0.5ML SOLN; Inject 15 mg into the skin once a week.  Dispense: 2 mL; Refill: 3    Risks and benefits discussed. Continue to focus on protein and fiber. Small frequent meals. Adequate hydration and electrolyte replacement. Will continue to exercise for at least a week including weight bearing exercise.   Return in about 3 months (around 09/27/2024) for Med Follow-up.  Sharday Michl B WHNP-BC 10:14 AM 06/29/2024  "

## 2024-09-29 ENCOUNTER — Ambulatory Visit: Admitting: Radiology
